# Patient Record
Sex: Female | Born: 1965
Health system: Southern US, Community
[De-identification: ages and names within clinical notes are randomized; demographics above are authoritative.]

## PROBLEM LIST (undated history)

## (undated) DIAGNOSIS — T8859XA Other complications of anesthesia, initial encounter: Secondary | ICD-10-CM

## (undated) DIAGNOSIS — K8020 Calculus of gallbladder without cholecystitis without obstruction: Secondary | ICD-10-CM

## (undated) DIAGNOSIS — R17 Unspecified jaundice: Secondary | ICD-10-CM

## (undated) DIAGNOSIS — D649 Anemia, unspecified: Secondary | ICD-10-CM

## (undated) DIAGNOSIS — T7840XA Allergy, unspecified, initial encounter: Secondary | ICD-10-CM

## (undated) DIAGNOSIS — N200 Calculus of kidney: Secondary | ICD-10-CM

## (undated) DIAGNOSIS — B001 Herpesviral vesicular dermatitis: Secondary | ICD-10-CM

## (undated) DIAGNOSIS — K648 Other hemorrhoids: Secondary | ICD-10-CM

## (undated) DIAGNOSIS — R87619 Unspecified abnormal cytological findings in specimens from cervix uteri: Secondary | ICD-10-CM

## (undated) DIAGNOSIS — R112 Nausea with vomiting, unspecified: Secondary | ICD-10-CM

## (undated) DIAGNOSIS — Z9889 Other specified postprocedural states: Secondary | ICD-10-CM

## (undated) DIAGNOSIS — C801 Malignant (primary) neoplasm, unspecified: Secondary | ICD-10-CM

## (undated) DIAGNOSIS — T4145XA Adverse effect of unspecified anesthetic, initial encounter: Secondary | ICD-10-CM

## (undated) HISTORY — PX: OTHER SURGICAL HISTORY: SHX169

## (undated) HISTORY — DX: Calculus of gallbladder without cholecystitis without obstruction: K80.20

## (undated) HISTORY — DX: Unspecified abnormal cytological findings in specimens from cervix uteri: R87.619

## (undated) HISTORY — DX: Herpesviral vesicular dermatitis: B00.1

## (undated) HISTORY — DX: Calculus of kidney: N20.0

## (undated) HISTORY — DX: Anemia, unspecified: D64.9

## (undated) HISTORY — DX: Allergy, unspecified, initial encounter: T78.40XA

## (undated) HISTORY — DX: Malignant (primary) neoplasm, unspecified: C80.1

## (undated) HISTORY — DX: Unspecified jaundice: R17

---
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
## 1997-11-14 ENCOUNTER — Ambulatory Visit (HOSPITAL_COMMUNITY): Admission: RE | Admit: 1997-11-14 | Discharge: 1997-11-14 | Payer: Self-pay | Admitting: Obstetrics and Gynecology

## 1998-06-06 HISTORY — PX: TUBAL LIGATION: SHX77

## 1998-10-13 ENCOUNTER — Ambulatory Visit (HOSPITAL_COMMUNITY): Admission: RE | Admit: 1998-10-13 | Discharge: 1998-10-13 | Payer: Self-pay | Admitting: Obstetrics and Gynecology

## 2000-07-28 ENCOUNTER — Other Ambulatory Visit: Admission: RE | Admit: 2000-07-28 | Discharge: 2000-07-28 | Payer: Self-pay | Admitting: Obstetrics and Gynecology

## 2001-10-25 ENCOUNTER — Other Ambulatory Visit: Admission: RE | Admit: 2001-10-25 | Discharge: 2001-10-25 | Payer: Self-pay | Admitting: Obstetrics and Gynecology

## 2003-04-30 ENCOUNTER — Other Ambulatory Visit: Admission: RE | Admit: 2003-04-30 | Discharge: 2003-04-30 | Payer: Self-pay | Admitting: Obstetrics and Gynecology

## 2003-06-17 ENCOUNTER — Ambulatory Visit (HOSPITAL_COMMUNITY): Admission: RE | Admit: 2003-06-17 | Discharge: 2003-06-17 | Payer: Self-pay | Admitting: Obstetrics and Gynecology

## 2004-02-24 ENCOUNTER — Other Ambulatory Visit: Admission: RE | Admit: 2004-02-24 | Discharge: 2004-02-24 | Payer: Self-pay | Admitting: Obstetrics and Gynecology

## 2004-07-23 ENCOUNTER — Other Ambulatory Visit: Admission: RE | Admit: 2004-07-23 | Discharge: 2004-07-23 | Payer: Self-pay | Admitting: Obstetrics and Gynecology

## 2004-07-30 ENCOUNTER — Encounter: Admission: RE | Admit: 2004-07-30 | Discharge: 2004-07-30 | Payer: Self-pay | Admitting: *Deleted

## 2005-03-22 ENCOUNTER — Encounter: Admission: RE | Admit: 2005-03-22 | Discharge: 2005-03-22 | Payer: Self-pay | Admitting: *Deleted

## 2005-06-06 HISTORY — PX: HEMORRHOID SURGERY: SHX153

## 2005-07-05 ENCOUNTER — Other Ambulatory Visit: Admission: RE | Admit: 2005-07-05 | Discharge: 2005-07-05 | Payer: Self-pay | Admitting: Obstetrics & Gynecology

## 2005-08-04 ENCOUNTER — Other Ambulatory Visit: Admission: RE | Admit: 2005-08-04 | Discharge: 2005-08-04 | Payer: Self-pay | Admitting: Obstetrics & Gynecology

## 2005-08-04 ENCOUNTER — Other Ambulatory Visit: Admission: RE | Admit: 2005-08-04 | Discharge: 2005-08-04 | Payer: Self-pay | Admitting: Obstetrics and Gynecology

## 2005-08-30 ENCOUNTER — Encounter (INDEPENDENT_AMBULATORY_CARE_PROVIDER_SITE_OTHER): Payer: Self-pay | Admitting: *Deleted

## 2005-08-30 ENCOUNTER — Ambulatory Visit (HOSPITAL_COMMUNITY): Admission: RE | Admit: 2005-08-30 | Discharge: 2005-08-30 | Payer: Self-pay | Admitting: General Surgery

## 2005-10-28 ENCOUNTER — Encounter: Admission: RE | Admit: 2005-10-28 | Discharge: 2005-10-28 | Payer: Self-pay | Admitting: Obstetrics and Gynecology

## 2005-11-04 ENCOUNTER — Other Ambulatory Visit: Admission: RE | Admit: 2005-11-04 | Discharge: 2005-11-04 | Payer: Self-pay | Admitting: Obstetrics and Gynecology

## 2005-12-28 ENCOUNTER — Other Ambulatory Visit: Admission: RE | Admit: 2005-12-28 | Discharge: 2005-12-28 | Payer: Self-pay | Admitting: Obstetrics and Gynecology

## 2006-05-09 ENCOUNTER — Other Ambulatory Visit: Admission: RE | Admit: 2006-05-09 | Discharge: 2006-05-09 | Payer: Self-pay | Admitting: Obstetrics and Gynecology

## 2006-06-06 HISTORY — PX: LITHOTRIPSY: SUR834

## 2006-06-06 HISTORY — PX: CHOLECYSTECTOMY: SHX55

## 2006-08-16 ENCOUNTER — Other Ambulatory Visit: Admission: RE | Admit: 2006-08-16 | Discharge: 2006-08-16 | Payer: Self-pay | Admitting: Obstetrics and Gynecology

## 2006-10-12 ENCOUNTER — Emergency Department (HOSPITAL_COMMUNITY): Admission: EM | Admit: 2006-10-12 | Discharge: 2006-10-12 | Payer: Self-pay | Admitting: Emergency Medicine

## 2006-10-17 ENCOUNTER — Ambulatory Visit: Payer: Self-pay | Admitting: Internal Medicine

## 2006-10-20 ENCOUNTER — Encounter: Admission: RE | Admit: 2006-10-20 | Discharge: 2006-10-20 | Payer: Self-pay | Admitting: Internal Medicine

## 2006-12-25 ENCOUNTER — Encounter (INDEPENDENT_AMBULATORY_CARE_PROVIDER_SITE_OTHER): Payer: Self-pay | Admitting: General Surgery

## 2006-12-25 ENCOUNTER — Ambulatory Visit (HOSPITAL_COMMUNITY): Admission: RE | Admit: 2006-12-25 | Discharge: 2006-12-25 | Payer: Self-pay | Admitting: General Surgery

## 2007-01-29 ENCOUNTER — Telehealth (INDEPENDENT_AMBULATORY_CARE_PROVIDER_SITE_OTHER): Payer: Self-pay | Admitting: *Deleted

## 2007-01-29 ENCOUNTER — Emergency Department (HOSPITAL_COMMUNITY): Admission: EM | Admit: 2007-01-29 | Discharge: 2007-01-29 | Payer: Self-pay | Admitting: Emergency Medicine

## 2007-01-29 ENCOUNTER — Telehealth: Payer: Self-pay | Admitting: Internal Medicine

## 2007-01-29 ENCOUNTER — Ambulatory Visit: Payer: Self-pay | Admitting: Internal Medicine

## 2007-01-29 ENCOUNTER — Ambulatory Visit: Payer: Self-pay | Admitting: Cardiology

## 2007-01-29 DIAGNOSIS — R319 Hematuria, unspecified: Secondary | ICD-10-CM | POA: Insufficient documentation

## 2007-01-29 DIAGNOSIS — B009 Herpesviral infection, unspecified: Secondary | ICD-10-CM | POA: Insufficient documentation

## 2007-01-29 DIAGNOSIS — R109 Unspecified abdominal pain: Secondary | ICD-10-CM | POA: Insufficient documentation

## 2007-01-29 LAB — CONVERTED CEMR LAB
Glucose, Urine, Semiquant: NEGATIVE
Nitrite: NEGATIVE
Specific Gravity, Urine: >=1.03
Urobilinogen, UA: 0.2
pH: 5.5

## 2007-02-01 ENCOUNTER — Encounter: Payer: Self-pay | Admitting: Internal Medicine

## 2007-02-07 ENCOUNTER — Other Ambulatory Visit: Admission: RE | Admit: 2007-02-07 | Discharge: 2007-02-07 | Payer: Self-pay | Admitting: Obstetrics and Gynecology

## 2007-02-12 ENCOUNTER — Ambulatory Visit (HOSPITAL_COMMUNITY): Admission: RE | Admit: 2007-02-12 | Discharge: 2007-02-12 | Payer: Self-pay | Admitting: Urology

## 2007-03-02 ENCOUNTER — Other Ambulatory Visit: Admission: RE | Admit: 2007-03-02 | Discharge: 2007-03-02 | Payer: Self-pay | Admitting: Obstetrics and Gynecology

## 2007-03-12 ENCOUNTER — Other Ambulatory Visit: Admission: RE | Admit: 2007-03-12 | Discharge: 2007-03-12 | Payer: Self-pay | Admitting: Obstetrics and Gynecology

## 2007-07-24 ENCOUNTER — Other Ambulatory Visit: Admission: RE | Admit: 2007-07-24 | Discharge: 2007-07-24 | Payer: Self-pay | Admitting: Obstetrics and Gynecology

## 2007-07-25 ENCOUNTER — Encounter: Payer: Self-pay | Admitting: Internal Medicine

## 2007-07-26 ENCOUNTER — Encounter: Payer: Self-pay | Admitting: Internal Medicine

## 2008-05-19 ENCOUNTER — Other Ambulatory Visit: Admission: RE | Admit: 2008-05-19 | Discharge: 2008-05-19 | Payer: Self-pay | Admitting: Obstetrics & Gynecology

## 2008-06-06 HISTORY — PX: LASER ABLATION: SHX1947

## 2008-12-24 ENCOUNTER — Ambulatory Visit: Payer: Self-pay | Admitting: Internal Medicine

## 2008-12-24 DIAGNOSIS — Z87442 Personal history of urinary calculi: Secondary | ICD-10-CM | POA: Insufficient documentation

## 2009-02-02 ENCOUNTER — Ambulatory Visit (HOSPITAL_BASED_OUTPATIENT_CLINIC_OR_DEPARTMENT_OTHER): Admission: RE | Admit: 2009-02-02 | Discharge: 2009-02-02 | Payer: Self-pay | Admitting: Obstetrics & Gynecology

## 2009-12-29 ENCOUNTER — Ambulatory Visit: Payer: Self-pay | Admitting: Internal Medicine

## 2010-03-10 ENCOUNTER — Encounter: Admission: RE | Admit: 2010-03-10 | Discharge: 2010-03-10 | Payer: Self-pay | Admitting: Obstetrics & Gynecology

## 2010-04-27 ENCOUNTER — Ambulatory Visit: Payer: Self-pay | Admitting: Internal Medicine

## 2010-04-27 DIAGNOSIS — L089 Local infection of the skin and subcutaneous tissue, unspecified: Secondary | ICD-10-CM | POA: Insufficient documentation

## 2010-05-27 ENCOUNTER — Ambulatory Visit: Payer: Self-pay | Admitting: Family Medicine

## 2010-06-27 ENCOUNTER — Encounter: Payer: Self-pay | Admitting: Obstetrics and Gynecology

## 2010-07-06 NOTE — Assessment & Plan Note (Signed)
Summary: bee sting/swelling/blisters/?inf/cjr   Vital Signs:  Patient profile:   45 year old female Menstrual status:  ablastion Height:      64 inches Weight:      159 pounds BMI:     27.39 Temp:     98.7 degrees F oral Pulse rate:   78 / minute BP sitting:   120 / 80  (left arm) Cuff size:   regular  Vitals Entered By: Romualdo Bolk, CMA (AAMA) (December 29, 2009 9:21 AM) CC: Bee sting on 7/24 on rt index finger. Swelling started on 7/25 and pt has taken benadryl and iced it. Pt is also got a some vaginal odor, slight vaginal discharge that is clear. No itching or burning upon urination. LMP - Character: heavy Menarche (age onset years): [age]   Menses interval (days): 28 Menstrual flow (days): 6 Menstrual Status ablastion   History of Present Illness: Meghan Frank comes in today  for acute visit for problem after an insect sting on right middle finger as  above. happened when riding bike and continued to go through the day.   sqeezed to check for stinger and saw none. had pain and itching and swelling. however today much more swollen and feels like finger going to burst  .  discomfort but not severe pain. some itch   .   Has hx of  large reactions local to stings but no analhylaxis or hives. Also has had some  vaginal odor  without sig  abnormal discharge . no lesons or ulcers . ? if bacterial infection . no itch or  antibioitic. use recently.  NO  uti signs   Preventive Screening-Counseling & Management  Alcohol-Tobacco     Alcohol drinks/day: 0     Smoking Status: quit     Packs/Day: 1/2     Year Quit: 2009  Caffeine-Diet-Exercise     Caffeine use/day: 1     Does Patient Exercise: no  Current Medications (verified): 1)  Valtrex 500 Mg  Tabs (Valacyclovir Hcl) .... Two Times A Day As Needed 2)  Cephalexin 500 Mg Caps (Cephalexin) .Marland Kitchen.. 1 By Mouth Three Times A Day For 7 Days  Allergies (verified): 1)  ! Sulfa  Past History:  Past medical, surgical, family and  social histories (including risk factors) reviewed, and no changes noted (except as noted below).  Past Medical History: Reviewed history from 12/24/2008 and no changes required. childbirth x2 Herpes labialis recurrent Nephrolithiasis, hx of   lithotrypsy.  Sep 2009     Past Surgical History: Reviewed history from 01/29/2007 and no changes required. Cholecystectomy (12/25/2006) btl Jaw surgery  Past History:  Care Management: Gynecology: Berneice Gandy, FNP Urology :   Family History: Reviewed history from 01/29/2007 and no changes required. Family History of CAD Female 1st degree relative <50 Family History Diabetes 1st degree relative Family History High cholesterol  Social History: Reviewed history from 12/24/2008 and no changes required. 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    Physical Exam  General:  alert and well-developed.   Head:  normocephalic, atraumatic, and no abnormalities observed.   Eyes:  vision grossly intact.   Neck:  No deformities, masses, or  tenderness noted. Lungs:  normal respiratory effort, no intercostal retractions, and no accessory muscle use.   Heart:  normal rate, regular rhythm, and no murmur.   Pulses:  pulses intact without delay   Extremities:  nl cap refill   riht middle finger with 2-3+ swelling pink only slightly warm and central sting area without F seen   dec rom from edema of whole finger to palm .   Neurologic:  alert & oriented X3, strength normal in all extremities, and gait normal.   Skin:  see finger exam  no other rashes  Psych:  Oriented X3, good eye contact, not anxious appearing, and not depressed appearing.     Impression &  Recommendations:  Problem # 1:  ? 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  can follow up with [facility] if persistent or  progressive   Her updated medication list for this problem includes:    Cephalexin 500 Mg Caps (Cephalexin) .Marland Kitchen... 1 by mouth three times a day for 7 days    Flagyl 500 Mg Tabs (Metronidazole) .Marland Kitchen... 1 by mouth two times a day  Orders: Prescription Created Electronically [id number])  Complete Medication List: 1)  Valtrex 500 Mg Tabs (Valacyclovir hcl) .... Two times a day as needed 2)  Cephalexin 500 Mg Caps (Cephalexin) .Marland Kitchen.. 1 by mouth three times a day for 7 days 3)  Flagyl 500 Mg Tabs (Metronidazole) .Marland Kitchen.. 1 by mouth two times a day  Patient Instructions: 1)  elevate finger    2)  begin antibiotic    elevation  and benadryl  also . 3)  expect improvement    in next 48 hours .  4)  take flagyl  after finger getting better.  for vaginal infection Prescriptions: FLAGYL 500 MG TABS (METRONIDAZOLE) 1 by mouth two times a day  #14 x 0   Entered and Authorized by:   Madelin Headings MD   Signed by:   Madelin Headings MD on 12/29/2009   Method used:   [facility]        [facility] [address] [phone]* (retail)       [address]       [place], [place]  [id number]       Ph: [phone] or [phone]       Fax: [phone]   RxID:   [id number] CEPHALEXIN 500 MG CAPS (CEPHALEXIN) 1 by mouth three times a day for 7 days  #21 x 0   Entered and Authorized by:    Madelin Headings MD   Signed by:   Madelin Headings MD on 12/29/2009   Method used:   [facility]        [facility]  [address] [phone]* (retail)       [address]       [place], [place]  [id number]  Ph: [phone] or [phone]       Fax: [phone]   RxID:   [id number]

## 2010-07-06 NOTE — Assessment & Plan Note (Signed)
Summary: EVAL OF CYST BEHIND EAR // RS   Vital Signs:  Patient profile:   45 year old female Menstrual status:  ablastion Weight:      157 pounds Temp:     99.0 degrees F oral Pulse rate:   78 / minute BP sitting:   110 / 60  (left arm) Cuff size:   regular  Vitals Entered By: Romualdo Bolk, CMA (AAMA) (April 27, 2010 2:52 PM) CC: Knot behind rt ear that is red and sore. Pt states it started on 11/19. It was more swollen a couple of days ago but the knot has gone down. It is still painful.   History of Present Illness: Meghan Frank  comes in today  for above  problem with 1 weeks of  of above   No trauma.  some better still tendern.   had ? hearing change nad tenderness of ear  outer also now better .Marland KitchenNO rx . No uri .    Preventive Screening-Counseling & Management  Alcohol-Tobacco     Alcohol drinks/day: 0     Smoking Status: quit     Packs/Day: 1/2     Year Quit: 2009  Caffeine-Diet-Exercise     Caffeine use/day: 1     Does Patient Exercise: no  Current Medications (verified): 1)  Valtrex 500 Mg  Tabs (Valacyclovir Hcl) .... Two Times A Day As Needed  Allergies (verified): 1)  ! Sulfa  Past History:  Past medical, surgical, family and social histories (including risk factors) reviewed, and no changes noted (except as noted below).  Past Medical History: Reviewed history from 12/24/2008 and no changes required. childbirth x2 Herpes labialis recurrent Nephrolithiasis, hx of   lithotrypsy.  Sep 2009     Past Surgical History: Reviewed history from 01/29/2007 and no changes required. Cholecystectomy (12/25/2006) btl Jaw surgery  Past History:  Care Management: Gynecology: Berneice Gandy, FNP Urology :   Family History: Reviewed history from 01/29/2007 and no changes required. Family History of CAD Female 1st degree relative <50 Family History Diabetes 1st degree relative Family History High cholesterol  Social History: Reviewed history from 12/24/2008  and no changes required. 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    Physical Exam  General:  Well-developed,well-nourished,in no acute distress; alert,appropriate and cooperative throughout examination Eyes:  clear  Ears:  L ear normal, no external deformities, and ear piercing(s) noted.    one whole not being used.  right tm nl  ovoid 1cm soft swelling mildly pink at crease between pinna and scalp no lesions  small infra autricular  node mobile and mildly tender  Nose:  no external deformity and no nasal discharge.   Mouth:  pharynx pink and moist.   Neck:  No deformities, masses, or tenderness noted. Lungs:  normal respiratory effort and no intercostal retractions.   Skin:  see ear exam  no vesicle or blisters Cervical Nodes:  no anterior cervical adenopathy and no posterior cervical adenopathy.   except small reactive infraauricular    Impression & Recommendations:  Problem # 1:  UNSPEC LOCAL INFECTION SKIN&SUBCUTANEOUS TISSUE (ICD-686.9) ? cystic but no  nodular    seems like skin infection in intertrigenous area of ear .  no skin weeping  and no fluctuance   although soft swelling  linear noted and poss secondary LN reactive .  disc  issues and will add antibiotic.     Complete Medication List: 1)  Valtrex 500 Mg Tabs (Valacyclovir hcl) .... Two times a day as needed 2)  Cephalexin 500 Mg Caps (Cephalexin) .Marland Kitchen.. 1 by mouth two times a day for 5-7 days  Other Orders: Admin 1st Vaccine (95621) Flu Vaccine 60yrs + (30865)  Patient Instructions: 1)  Warm compresses to local area . and antibioitc  2)  I think   is an infected cyst getting better . 3)  Expect resolution  or signiificant  improvement  in then    next  5 days.  Prescriptions: CEPHALEXIN 500 MG CAPS  (CEPHALEXIN) 1 by mouth two times a day for 5-7 days  #14 x 0   Entered and Authorized by:   Madelin Headings MD   Signed by:   Madelin Headings MD on 04/27/2010   Method used:   [facility]        [facility] [address] [phone]* (retail)       [address]       [place], [place]  [id number]       Ph: [phone] or [phone]       Fax: [phone]   RxID:   [id number]    Orders Added: 1)  Admin 1st Vaccine [90471] 2)  Flu Vaccine 73yrs + [30160] 3)  Est. Patient Level III [10932]     Flu Vaccine Consent Questions     Do you have a history of severe allergic reactions to this vaccine? no    Any prior history of allergic reactions to egg and/or gelatin? no    Do you have a sensitivity to the preservative Thimersol? no    Do you have a past history of Guillan-Barre Syndrome? no    Do you currently have an acute febrile illness? no    Have you ever had a severe reaction to latex? no    Vaccine information given and explained to patient? yes    Are you currently pregnant? no    Lot Number:[id number]   Exp Date:12/04/2010   Site Given  Left Deltoid IM .lbflu Romualdo Bolk, CMA Duncan Dull)  April 27, 2010 2:56 PM

## 2010-07-08 NOTE — Assessment & Plan Note (Signed)
Summary: st/cough/njr   Vital Signs:  Patient profile:   45 year old female Menstrual status:  ablastion Temp:     98.2 degrees F oral BP sitting:   120 / 80  (left arm) Cuff size:   regular  Vitals Entered By: Sid Falcon LPN (May 27, 2010 4:09 PM)  History of Present Illness: One week hx of URI symptoms of sore throat, postnasal drip, cough, fatigue, and bil ear pressure.  No signif headache. Sore throat is some better now. Concerned that things tend to settle in her chest and difficult to clear. Ex-smoker.  No ill contacts.  Allergies: 1)  ! Sulfa  Past History:  Past Medical History: Reviewed history from 12/24/2008 and no changes required. childbirth x2 Herpes labialis recurrent Nephrolithiasis, hx of   lithotrypsy.  Sep 2009     Social History: Reviewed history from 12/24/2008 and no changes required. 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  Otoscopic examination reveals clear canals, tympanic membranes are intact bilaterally without bulging, retraction, inflammation or discharge. Hearing is grossly normal bilaterally. Mouth:  Oral mucosa and oropharynx without lesions or exudates.  Teeth in good repair. Neck:  No deformities, masses, or tenderness noted. Lungs:  Normal respiratory effort, chest expands symmetrically. Lungs are clear to auscultation, no crackles or wheezes. Heart:  normal rate, regular rhythm, and no murmur.     Impression & Recommendations:  Problem # 1:  ACUTE BRONCHITIS (ICD-466.0) suspect viral  We have not rec any antibiotics at this time but printed rx to fill only if symptoms worsen. Her updated medication list for this problem includes:    Azithromycin 250 Mg  Tabs (Azithromycin) .Marland Kitchen... 2 by mouth today then one by mouth once daily for 4 days  Complete Medication List: 1)  Valtrex 500 Mg Tabs (Valacyclovir hcl) .... Two times a day as needed 2)  Azithromycin 250 Mg Tabs (Azithromycin) .... 2 by mouth today then one by mouth once daily for 4 days  Patient Instructions: 1)  Acute Bronchitis symptoms for less then 10 days are not  helped by antibiotics. Take over the counter cough medications. Call if no improvement in 5-7 days, sooner if increasing cough, fever, or new symptoms ( shortness of breath, chest pain) .  Prescriptions: AZITHROMYCIN 250 MG TABS (AZITHROMYCIN) 2 by mouth today then one by mouth once daily for 4 days  #6 x 0   Entered and Authorized by:   Evelena Peat MD   Signed by:   Evelena Peat MD on 05/27/2010   Method used:   Print then Give to Patient   RxID:   [id number]    Orders Added: 1)  Est. Patient Level III [56213]

## 2010-07-12 ENCOUNTER — Other Ambulatory Visit: Payer: Self-pay | Admitting: Dermatology

## 2010-09-11 LAB — CBC
HCT: 36.7 % (ref 36.0–46.0)
Hemoglobin: 12.5 g/dL (ref 12.0–15.0)
MCHC: 34.2 g/dL (ref 30.0–36.0)
MCV: 95.4 fL (ref 78.0–100.0)
Platelets: 211 10*3/uL (ref 150–400)
RBC: 3.85 MIL/uL — ABNORMAL LOW (ref 3.87–5.11)
RDW: 12.9 % (ref 11.5–15.5)
WBC: 4.8 10*3/uL (ref 4.0–10.5)

## 2010-09-11 LAB — URINALYSIS, ROUTINE W REFLEX MICROSCOPIC
Bilirubin Urine: NEGATIVE
Glucose, UA: NEGATIVE mg/dL
Ketones, ur: NEGATIVE mg/dL
Nitrite: NEGATIVE
Protein, ur: NEGATIVE mg/dL
Specific Gravity, Urine: 1.023 (ref 1.005–1.030)
Urobilinogen, UA: 0.2 mg/dL (ref 0.0–1.0)
pH: 5.5 (ref 5.0–8.0)

## 2010-09-11 LAB — POCT PREGNANCY, URINE: Preg Test, Ur: NEGATIVE

## 2010-09-11 LAB — URINE MICROSCOPIC-ADD ON

## 2010-10-19 NOTE — Op Note (Signed)
NAMESIBONEY, REQUEJO               ACCOUNT NO.:  [id number]   MEDICAL RECORD NO.:  [id number]          PATIENT TYPE:  AMB   LOCATION:  SDS                          FACILITY:  [facility]   PHYSICIAN:  Ollen Gross. Vernell Morgans, M.D. DATE OF BIRTH:  04/25/1966   DATE OF PROCEDURE:  12/25/2006  DATE OF DISCHARGE:                               OPERATIVE REPORT   PREOPERATIVE DIAGNOSIS:  Gallstones.   POSTOPERATIVE DIAGNOSIS:  Gallstones.   PROCEDURES:  Laparoscopic cholecystectomy with intraoperative  cholangiogram.   SURGEON:  Ollen Gross. Vernell Morgans, M.D.   ASSISTANT:  Angelia Mould. Derrell Lolling, M.D.   ANESTHESIA:  General endotracheal.   PROCEDURE:  After informed consent was obtained, the patient was brought  to the operating room, placed in supine position on the table.  After  induction of general anesthesia, the patient's abdomen was prepped with  Betadine and draped in usual sterile manner.  The area below the  umbilicus was infiltrated with 4% Marcaine.  A small incision was made  with 15 blade knife.  This incision was carried down through the  subcutaneous tissue bluntly with hemostat and [employer] retractors until  the linea alba was identified.  The linea alba was incised with a 15  blade knife and each side was grasped with Kocher clamps and elevated  anteriorly.  The preperitoneal space was then probed bluntly with a  hemostat until the peritoneum was opened and access was gained to the  abdominal cavity.  A 0-0 Vicryl pursestring stitch was placed in the  fascia around the opening Hasson cannula was placed through the opening  and anchored in place with the previously placed Vicryl pursestring  stitch.  The abdomen was then insufflated with carbon dioxide without  difficulty.  The patient was placed in reverse Trendelenburg position  and rotated with the right side up.  The laparoscope was inserted  through the Hasson cannula and the right upper quadrant was inspected.  The dome of  gallbladder and liver readily identified.  The rest see  abdomen was inspected.  No other abnormalities were noted.  The patient  had a small adhesive band on the top of the liver.  The epigastric  region was then infiltrated 0.25% Marcaine.  A small incision was made  with a 15 blade knife and a 10 mm port was placed bluntly through this  incision into the abdominal cavity under direct vision.  Sites then  chosen laterally on the right side of the abdomen for placement of 5-mm  ports.  Each of these areas was infiltrated with 0.25% Marcaine.  Small  stab incisions were made with a 15 blade knife.  5 mm ports were placed  bluntly through these incisions into the abdominal cavity under direct  vision.  A blunt grasper was placed through the lateral-most 5-mm port  and used to grasp the dome of gallbladder and elevate it anteriorly and  superiorly.  The small band on top of the liver was divided sharply with  a laparoscopic scissors.  Another blunt grasper was placed through the  other 5 mm port and used  to retract on the body and neck of the  gallbladder.  A dissector was placed through the epigastric port and  using electrocautery, the peritoneal reflection was opened at the  gallbladder neck.  Blunt dissection was then carried out in this area  until the gallbladder neck, cystic duct junction was readily identified.  A good window was created.  A single clip was placed on the gallbladder  neck.  A small ductotomy was made just below the clip.  A 14 gauge  Angiocath was then placed percutaneously through the anterior abdominal  wall under direct vision.  A Reddick cholangiogram catheter placed  through the angiocath and flushed.  The Reddick catheter was then placed  within the cystic duct and anchored in place with a clip.  Cholangiogram  was obtained that showed no filling defects, good emptying into the  duodenum and adequate length on the cystic duct.  The anchoring clip and  catheters  were removed from the patient.  Three clips were placed  proximally on the cystic duct and the duct was divided between the two  sets of clips.  Posterior to this the cystic artery was identified and  again dissected bluntly in a circumferential manner until a good window  was created.  Two clips placed proximally and one distally on the artery  and the artery was divided between the two.  Next a laparoscopic hook  cautery device was used to separate the gallbladder from the liver bed.  Prior to completely detaching gallbladder from the liver bed, the liver  bed was inspected and several small bleeding points were coagulated with  electrocautery until the area was completely hemostatic.  Gallbladder  was then detached the rest of the way from liver bed without difficulty  with a hook cautery.  The laparoscope was then moved to the epigastric  port.  The gallbladder grasper was placed through the Hasson cannula and  used to grasp the neck of the gallbladder.  The gallbladder was removed  through the infraumbilical port with the Hasson cannula without  difficulty.  The abdomen was then irrigated copious amounts of saline  until the effluent was clear.  The liver bed was inspected again and  found to be hemostatic.  The fascial defect was closed with the  previously placed Vicryl pursestring stitch as well as with another  figure-of-eight zero Vicryl stitch.  The rest of the ports were removed  under direct vision and were all found to be hemostatic.  Gas was  allowed to escape.  The skin incisions were all closed with interrupted  4-0 Monocryl subcuticular stitches.  Benzoin and Steri-Strips were  applied.  The patient tolerated the procedure well.  At the end of the  case all needle, sponge and instrument counts correct.  The patient was  awakened, taken recovery room in stable condition.      Ollen Gross. Vernell Morgans, M.D.  Electronically Signed     PST/MEDQ  D:  12/25/2006  T:  12/25/2006   Job:  [id number]

## 2010-10-19 NOTE — Op Note (Signed)
Meghan Frank, Meghan Frank               ACCOUNT NO.:  [id number]   MEDICAL RECORD NO.:  [id number]          PATIENT TYPE:  AMB   LOCATION:  [facility]                         FACILITY:  [facility]   PHYSICIAN:  M. Leda Quail, MD  DATE OF BIRTH:  November 12, 1965   DATE OF PROCEDURE:  02/02/2009  DATE OF DISCHARGE:                               OPERATIVE REPORT   PREOPERATIVE DIAGNOSES:  64. A 45 year old G2 P2 divorced white female with history of      menorrhagia.  2. Dysfunctional uterine bleeding.  3. History of postpartum bilateral tubal ligation in 2000.  4. Endometrial biopsy obtained January 14, 2009 which was negative.   POSTOPERATIVE DIAGNOSES:  18. A 45 year old G2 P2 divorced white female with history of      menorrhagia.  2. Dysfunctional uterine bleeding.  3. History of postpartum bilateral tubal ligation in 2000.  4. Endometrial biopsy obtained January 14, 2009 which was negative.   PROCEDURE:  Hysteroscopy and NovaSure ablation.   SURGEON:  M. Leda Quail, MD   ASSISTANT:  OR staff.   ANESTHESIA:  LMA, Dr. Rica Mast oversaw the case.   FINDINGS:  Normal-appearing endometrial cavity.  Endometrium is thin.  The patient just recently had her menstrual cycle.  The tubal ostia  noted bilaterally.   SPECIMENS:  None.   ESTIMATED BLOOD LOSS:  Minimal.   FLUIDS:  800 mL of LR.   URINE OUTPUT:  200 mL, drained with I and O catheterization at the  beginning of the procedure.   FLUID DEFICIT:  40 mL of LR.   COMPLICATIONS:  None.   INDICATIONS:  Ms. Andrey Campanile is very nice 45 year old G2 P2 divorced white  female who has a history of menorrhagia, irregular bleeding.  She has  tried birth control pills in the past and has issues with a weight gain  on them.  She was initially evaluated with an ultrasound on January 14, 2009.  Uterus measured 10 x 4.5 x 6 cm.  Her endometrium was quite  thick.  She did have one small involuting cyst on the right ovary and  her sonohysterogram was  negative.  A biopsy was obtained which was also  negative.  We discussed options and as she has failed conservative  therapy due to side effects in the past, she was ready to proceed with  the other options and we discussed hormonal IUD use, endometrial  ablation, progestin therapy and she after discussing risks and benefits  of each of these decided to proceed with endometrial dilation.  The  procedure was described.  The risks and benefits were described.  This  is all documented in her chart she went home and discussed this with her  significant other and decided to proceed.   HOSPITAL COURSE:  The patient was admitted through same-day surgery.  She is taken to the operating room.  She is placed in supine position.  Anesthesia was administered by anesthesia staff without difficulty.  She  had a running IV in place in her left arm.  Legs were positioned in the  low lithotomy position in [place] stirrups.  SCDs were present on lower  extremities bilaterally.  Legs were lifted to the high lithotomy  position.  Perineum, inner thighs and vagina were prepped in normal  sterile fashion.  A red rubber Foley catheter was used to drain the  bladder of all urine.  The patient was then draped in normal sterile  fashion.   Attention is turned toward the vagina.  A bivalve speculum placed in the  vagina.  The cervix is well visualized.  The anterior lip of the cervix  was grasped with a single-tooth tenaculum.  A paracervical block with 1%  lidocaine mixed 1:1 with epinephrine (1:200,000 units) was instilled.  10 mL total was used.  The uterus was then sounded to 9 cm.  Using Hegar  dilators, the cervix was dilated up to #7.  A 5-mm diagnostic  hysteroscope was obtained.  It was passed through the endocervical canal  and the endometrial cavity was visualized.  The tissue was thin and the  tubal ostia were noted bilaterally.  There were no abnormal shapes in  the cavity.  Photo documentation was  made.  The hysteroscope was  removed.  The cervix dilated up to a #8.  Then the NovaSure ablation  device was obtained.  The array was opened outside the patient.  The  vacuum valve was tapped.  It was working properly.  The device was then  closed.  It was passed through the endocervical canal to the fundus of  the uterus.  The array was opened inside the patient.  Then the device  was seated by moving the device anterior, posterior, to the right and  left and twisting 90 degrees in each direction.  This was performed  again.  The cavity length was 4.5 cm.  The array opened to a 4.4 for  cavity width and power at this point was 109.  At this point cavity  assessment test was performed and passed without difficulty.  The  ablation cycle was started.  It ended after approximately 120 seconds.  The array was then closed.  The device was brought out through the  cervix.  The hysteroscope was used to visualize the cavity again and  excellent ablation was present with good cauterization up to the cornu  of the uterus bilaterally.  The hysteroscope was removed.  There was 40  mL deficit of LR hysteroscopic fluid for the case.  There was similar  amount leaking on the floor.  The tenaculum was removed from the cervix.  Two silver nitrate were used to obtain hemostasis at tenaculum sites.  The speculum was removed from the vagina.   Betadine prep was cleansed off the skin.  Legs were positioned back in  the supine position.  The patient was awake from anesthesia.  Sponge,  lap, needle and instrument counts were correct x2.  The patient was  taken to the recovery room stable condition.      Lum Keas, MD  Electronically Signed     MSM/MEDQ  D:  02/02/2009  T:  02/02/2009  Job:  [id number]

## 2010-10-22 NOTE — Op Note (Signed)
NAMESHYLIN, KEIZER               ACCOUNT NO.:  [id number]   MEDICAL RECORD NO.:  [id number]          PATIENT TYPE:  AMB   LOCATION:  DAY                          FACILITY:  [facility]   PHYSICIAN:  Timothy E. Earlene Plater, M.D. DATE OF BIRTH:  1965-10-20   DATE OF PROCEDURE:  08/30/2005  DATE OF DISCHARGE:  08/30/2005                                 OPERATIVE REPORT   PREOPERATIVE DIAGNOSIS:  External hemorrhoids.   POSTOPERATIVE DIAGNOSIS:  External hemorrhoids.   PROCEDURE:  External hemorrhoidectomy.   SURGEON:  Timothy E. Earlene Plater, M.D.   ANESTHESIA:  General.   Ms. Meghan Frank is a healthy 45 year old female who has developed and maintained  external hemorrhoids since her pregnancies beginning 18 years ago.  She  reports that her bowel action is normal at this time, and she would like to  have these external hemorrhoids removed due to irritation, pain, discomfort,  drainage and difficulty cleansing.  She is prepared and ready.  She is  identified and the permit signed.   The patient is taken to the operating room, placed supine, LMA anesthesia  provided.  She was placed in lithotomy, the perianal area inspected, prepped  and draped.  Digital anoscopic exam revealed no evidence of internal  disease.  Specifically, at the right posterior, left posterior and left  anterior aspect of the anal orifice were redundant external skin and tags  with moderate anodermal components.  The area was injected around and about  with Marcaine, epinephrine and Wydase, massaged in well.  Very carefully  without the scope in place, the external tags were simply excised.  Each  area was then undermined and the superficial varicosities removed and then  each wound carefully closed with running 3-0 chromic suture.  The incisions  were at the right posterior, left posterior, left anterior and then a small  tag at the direct posterior area were all removed.  The result was  satisfactory.  The perianal skin was smooth  and flat.  There were no  excesses.  No complications.  Sphincter was intact.  Gelfoam gauze and dry  sterile dressing applied.  She had tolerated it well, was removed to the  recovery room in good condition.   Careful written and verbal instructions were given her and her father along  with Percocet for pain, and she will be seen and followed in the office.      Timothy E. Earlene Plater, M.D.  Electronically Signed     TED/MEDQ  D:  08/30/2005  T:  09/01/2005  Job:  [id number]

## 2011-03-21 LAB — DIFFERENTIAL
Basophils Absolute: 0
Basophils Relative: 0
Eosinophils Absolute: 0.1
Eosinophils Relative: 1
Lymphocytes Relative: 26
Lymphs Abs: 1.7
Monocytes Absolute: 0.5
Monocytes Relative: 8
Neutro Abs: 4.3
Neutrophils Relative %: 65

## 2011-03-21 LAB — COMPREHENSIVE METABOLIC PANEL
ALT: 22
AST: 17
Albumin: 4.1
Alkaline Phosphatase: 42
BUN: 14
CO2: 25
Calcium: 9.6
Chloride: 109
Creatinine, Ser: 0.72
GFR calc Af Amer: >60
GFR calc non Af Amer: >60
Glucose, Bld: 84
Potassium: 4.2
Sodium: 139
Total Bilirubin: 1.9 — ABNORMAL HIGH
Total Protein: 7

## 2011-03-21 LAB — CBC
HCT: 36.9
Hemoglobin: 12.6
MCHC: 34
MCV: 93.8
Platelets: 259
RBC: 3.94
RDW: 13.3
WBC: 6.6

## 2011-04-28 ENCOUNTER — Encounter (HOSPITAL_COMMUNITY): Payer: Self-pay | Admitting: *Deleted

## 2011-04-28 ENCOUNTER — Observation Stay (HOSPITAL_COMMUNITY)
Admission: AD | Admit: 2011-04-28 | Discharge: 2011-04-29 | Disposition: A | Discharge status: Home / Self Care | Payer: BC Managed Care – PPO | Source: Ambulatory Visit | Attending: Urology | Admitting: Urology

## 2011-04-28 DIAGNOSIS — N201 Calculus of ureter: Principal | ICD-10-CM | POA: Insufficient documentation

## 2011-04-28 DIAGNOSIS — Z87442 Personal history of urinary calculi: Secondary | ICD-10-CM

## 2011-04-28 DIAGNOSIS — R109 Unspecified abdominal pain: Secondary | ICD-10-CM | POA: Insufficient documentation

## 2011-04-28 DIAGNOSIS — R112 Nausea with vomiting, unspecified: Secondary | ICD-10-CM | POA: Insufficient documentation

## 2011-04-28 DIAGNOSIS — N200 Calculus of kidney: Secondary | ICD-10-CM | POA: Insufficient documentation

## 2011-04-28 HISTORY — DX: Nausea with vomiting, unspecified: R11.2

## 2011-04-28 HISTORY — DX: Adverse effect of unspecified anesthetic, initial encounter: T41.45XA

## 2011-04-28 HISTORY — DX: Other complications of anesthesia, initial encounter: T88.59XA

## 2011-04-28 HISTORY — DX: Other specified postprocedural states: Z98.890

## 2011-04-28 LAB — BASIC METABOLIC PANEL
BUN: 19 mg/dL (ref 6–23)
CO2: 24 mEq/L (ref 19–32)
Chloride: 104 mEq/L (ref 96–112)
Creatinine, Ser: 0.84 mg/dL (ref 0.50–1.10)

## 2011-04-28 LAB — CBC
HCT: 31 % — ABNORMAL LOW (ref 36.0–46.0)
Hemoglobin: 11 g/dL — ABNORMAL LOW (ref 12.0–15.0)
MCV: 91.2 fL (ref 78.0–100.0)
RBC: 3.4 MIL/uL — ABNORMAL LOW (ref 3.87–5.11)
WBC: 7.5 10*3/uL (ref 4.0–10.5)

## 2011-04-28 MED ORDER — CEFAZOLIN SODIUM-DEXTROSE 2-3 GM-% IV SOLR
2.0000 g | INTRAVENOUS | Status: AC
  Administered 2011-04-29: 2 g via INTRAVENOUS
  Filled 2011-04-28: qty 50

## 2011-04-28 MED ORDER — KETOROLAC TROMETHAMINE 30 MG/ML IJ SOLN
30.0000 mg | Freq: Four times a day (QID) | INTRAMUSCULAR | Status: DC
  Administered 2011-04-28 – 2011-04-29 (×3): 30 mg via INTRAVENOUS
  Filled 2011-04-28 (×4): qty 1
  Filled 2011-04-28: qty 2

## 2011-04-28 MED ORDER — ONDANSETRON HCL 4 MG/2ML IJ SOLN
4.0000 mg | Freq: Four times a day (QID) | INTRAMUSCULAR | Status: DC
  Administered 2011-04-28 – 2011-04-29 (×2): 4 mg via INTRAVENOUS
  Filled 2011-04-28 (×2): qty 2

## 2011-04-28 MED ORDER — KCL IN DEXTROSE-NACL 10-5-0.45 MEQ/L-%-% IV SOLN
INTRAVENOUS | Status: DC
  Administered 2011-04-28: 23:00:00 via INTRAVENOUS
  Filled 2011-04-28 (×3): qty 1000

## 2011-04-28 MED ORDER — HYDROMORPHONE HCL PF 1 MG/ML IJ SOLN
0.5000 mg | INTRAMUSCULAR | Status: DC | PRN
  Administered 2011-04-28 – 2011-04-29 (×3): 0.5 mg via INTRAVENOUS
  Filled 2011-04-28 (×3): qty 1

## 2011-04-28 MED ORDER — TAMSULOSIN HCL 0.4 MG PO CAPS: 0.4000 mg | ORAL_CAPSULE | ORAL | Status: DC

## 2011-04-28 MED ORDER — ACETAMINOPHEN 10 MG/ML IV SOLN
1000.0000 mg | Freq: Four times a day (QID) | INTRAVENOUS | Status: DC
  Administered 2011-04-28 – 2011-04-29 (×3): 1000 mg via INTRAVENOUS
  Filled 2011-04-28 (×4): qty 100

## 2011-04-28 NOTE — H&P (Signed)
Meghan Frank is an 45 y.o. female.    HPI: The patient is a 45 year old female who was seen yesterday as an outpatient, complaining of right flank pain, now with right and left flank pain for greater than 24 hours. She also has lower left abdominal pain, and urethral pain. She has had nausea and vomiting. The patient has a known 4 mm left proximal ureteral calculus, and bilateral renal stones, measuring 6 mm x 4 mm. CT scan yesterday shows 2 mm proximal right ureteral stone, with migration of the left ureteral stone into the distal left ureter. The patient has 2 remaining calculi within the right kidney. She is admitted for nausea and vomiting control, pain control, and will undergo cystoscopy, bilateral retrograde Polygram, and basket extraction tomorrow morning.  Past Medical History  Diagnosis Date  . Complication of anesthesia     "doesn't wake up well"  . PONV (postoperative nausea and vomiting)     Past Surgical History  Procedure Date  . Cholecystectomy 2008  . Tubal ligation 2000  . Laser ablation 2010    uterine ablation  . Lithotripsy 2008  . Mandible surgery 1985    No current facility-administered medications on file as of 04/28/2011.   No current outpatient prescriptions on file as of 04/28/2011.    Allergies:  Allergies  Allergen Reactions  . Sulfonamide Derivatives     History reviewed. No pertinent family history.  Social History:  reports that she quit smoking 2 days ago. Her smoking use included Cigarettes. She has never used smokeless tobacco. She reports that she does not drink alcohol or use illicit drugs.  Review of Systems: Pertinent items are noted in HPI. A comprehensive review of systems was negative except for: Constitutional: positive for fatigue Gastrointestinal: positive for abdominal pain, constipation, nausea and vomiting  No results found for this or any previous visit (from the past 48 hour(s)).  No results found.     Physical  Exam: General appearance: alert and appears stated age Head: Normocephalic, without obvious abnormality, atraumatic Eyes: conjunctivae/corneas clear. EOM's intact.  Oropharynx: moist mucous membranes Neck: supple, symmetrical, trachea midline Resp: normal respiratory effort Cardio: regular rate and rhythm Back: symmetric, no curvature. ROM normal. No CVA tenderness. GI: soft, non-tender; bowel sounds distant; no masses,  no organomegaly. Bilateral flank pain, LLQ pain, and L inguinal pain. no hernias Pelvic: deferred Extremities: extremities normal, atraumatic, no cyanosis or edema Skin: Skin color normal. No visible rashes or lesions Neurologic: Grossly normal  Assessment/Plan Bilateral ureteral totnes, 4mm L distal stone with hydronephrosis, and 2mm R upper ureteral stone, failing outpatient Rx of Flomax, and pain med. Will plan to admit for nausea/vomiting/pain control, and will remove stones in AM.   Jonathyn Carothers I 04/28/2011, 8:46 PM

## 2011-04-29 ENCOUNTER — Observation Stay (HOSPITAL_COMMUNITY): Payer: BC Managed Care – PPO | Admitting: Anesthesiology

## 2011-04-29 ENCOUNTER — Encounter (HOSPITAL_COMMUNITY)
Admission: AD | Disposition: A | Discharge status: Home / Self Care | Payer: Self-pay | Source: Ambulatory Visit | Attending: Urology

## 2011-04-29 ENCOUNTER — Encounter (HOSPITAL_COMMUNITY): Payer: Self-pay | Admitting: Anesthesiology

## 2011-04-29 HISTORY — PX: CYSTOSCOPY/RETROGRADE/URETEROSCOPY/STONE EXTRACTION WITH BASKET: SHX5317

## 2011-04-29 LAB — SURGICAL PCR SCREEN
MRSA, PCR: NEGATIVE
Staphylococcus aureus: NEGATIVE

## 2011-04-29 LAB — URINE MICROSCOPIC-ADD ON

## 2011-04-29 LAB — URINALYSIS, ROUTINE W REFLEX MICROSCOPIC
Bilirubin Urine: NEGATIVE
Glucose, UA: NEGATIVE mg/dL
Ketones, ur: NEGATIVE mg/dL
Leukocytes, UA: NEGATIVE
pH: 6.5 (ref 5.0–8.0)

## 2011-04-29 SURGERY — CYSTOSCOPY, WITH CALCULUS REMOVAL USING BASKET
Anesthesia: General | Site: Ureter | Laterality: Bilateral | Wound class: Clean Contaminated

## 2011-04-29 MED ORDER — OXYCODONE-ACETAMINOPHEN 5-325 MG PO TABS: 1.0000 | ORAL_TABLET | Freq: Four times a day (QID) | ORAL | Status: DC | PRN

## 2011-04-29 MED ORDER — MIDAZOLAM HCL 5 MG/5ML IJ SOLN
INTRAMUSCULAR | Status: DC | PRN
  Administered 2011-04-29 (×2): 1 mg via INTRAVENOUS

## 2011-04-29 MED ORDER — PROPOFOL 10 MG/ML IV EMUL
INTRAVENOUS | Status: DC | PRN
  Administered 2011-04-29: 120 mg via INTRAVENOUS

## 2011-04-29 MED ORDER — POTASSIUM CITRATE ER 15 MEQ (1620 MG) PO TBCR
2.0000 | EXTENDED_RELEASE_TABLET | Freq: Two times a day (BID) | ORAL | Status: DC
Start: 2011-04-29 — End: 2011-04-29

## 2011-04-29 MED ORDER — TAMSULOSIN HCL 0.4 MG PO CAPS
0.4000 mg | ORAL_CAPSULE | Freq: Every day | ORAL | Status: DC
  Administered 2011-04-29: 0.4 mg via ORAL
  Filled 2011-04-29 (×2): qty 1

## 2011-04-29 MED ORDER — BELLADONNA ALKALOIDS-OPIUM 16.2-60 MG RE SUPP
RECTAL | Status: DC | PRN
  Administered 2011-04-29: 1 via RECTAL

## 2011-04-29 MED ORDER — ACETAMINOPHEN 325 MG PO TABS: 650.0000 mg | ORAL_TABLET | Freq: Four times a day (QID) | ORAL | Status: DC | PRN

## 2011-04-29 MED ORDER — HYDROCODONE-ACETAMINOPHEN 7.5-650 MG PO TABS: 1.0000 | ORAL_TABLET | Freq: Four times a day (QID) | ORAL | Status: AC | PRN

## 2011-04-29 MED ORDER — IOHEXOL 300 MG/ML  SOLN
INTRAMUSCULAR | Status: DC | PRN
  Administered 2011-04-29: 10 mL

## 2011-04-29 MED ORDER — LACTATED RINGERS IV SOLN
INTRAVENOUS | Status: DC | PRN
  Administered 2011-04-29: 13:00:00 via INTRAVENOUS

## 2011-04-29 MED ORDER — VALACYCLOVIR HCL 500 MG PO TABS: 1000.0000 mg | ORAL_TABLET | ORAL | Status: DC

## 2011-04-29 MED ORDER — DEXAMETHASONE SODIUM PHOSPHATE 10 MG/ML IJ SOLN
INTRAMUSCULAR | Status: DC | PRN
  Administered 2011-04-29: 10 mg via INTRAVENOUS

## 2011-04-29 MED ORDER — SCOPOLAMINE 1 MG/3DAYS TD PT72
MEDICATED_PATCH | TRANSDERMAL | Status: DC | PRN
  Administered 2011-04-29: 1 via TRANSDERMAL

## 2011-04-29 MED ORDER — POTASSIUM CITRATE ER 10 MEQ (1080 MG) PO TBCR
30.0000 meq | EXTENDED_RELEASE_TABLET | Freq: Two times a day (BID) | ORAL | Status: DC
  Filled 2011-04-29 (×3): qty 3

## 2011-04-29 MED ORDER — OXYCODONE-ACETAMINOPHEN 10-650 MG PO TABS: 1.0000 | ORAL_TABLET | Freq: Four times a day (QID) | ORAL | Status: DC | PRN

## 2011-04-29 MED ORDER — SODIUM CHLORIDE 0.9 % IR SOLN
Status: DC | PRN
  Administered 2011-04-29: 3000 mL

## 2011-04-29 MED ORDER — POTASSIUM CITRATE ER 15 MEQ (1620 MG) PO TBCR: 2.0000 | EXTENDED_RELEASE_TABLET | Freq: Two times a day (BID) | ORAL | Status: DC

## 2011-04-29 MED ORDER — HYDROMORPHONE HCL PF 1 MG/ML IJ SOLN
0.2500 mg | INTRAMUSCULAR | Status: DC | PRN
  Administered 2011-04-29 (×2): 0.25 mg via INTRAVENOUS
  Administered 2011-04-29: 0.5 mg via INTRAVENOUS

## 2011-04-29 MED ORDER — PROMETHAZINE HCL 25 MG/ML IJ SOLN
INTRAMUSCULAR | Status: AC
  Filled 2011-04-29: qty 1

## 2011-04-29 MED ORDER — KETOROLAC TROMETHAMINE 15.75 MG/SPRAY NA SOLN: 1.0000 | Freq: Three times a day (TID) | NASAL | Status: DC | PRN

## 2011-04-29 MED ORDER — PROMETHAZINE HCL 25 MG/ML IJ SOLN
6.2500 mg | INTRAMUSCULAR | Status: DC | PRN
  Administered 2011-04-29: 6.25 mg via INTRAVENOUS

## 2011-04-29 MED ORDER — OXYCODONE HCL 5 MG PO TABS
5.0000 mg | ORAL_TABLET | Freq: Four times a day (QID) | ORAL | Status: DC | PRN
  Administered 2011-04-29: 5 mg via ORAL
  Filled 2011-04-29: qty 1

## 2011-04-29 MED ORDER — URELLE 81 MG PO TABS: 1.0000 | ORAL_TABLET | Freq: Three times a day (TID) | ORAL | Status: DC

## 2011-04-29 MED ORDER — FENTANYL CITRATE 0.05 MG/ML IJ SOLN
INTRAMUSCULAR | Status: DC | PRN
  Administered 2011-04-29 (×2): 50 ug via INTRAVENOUS

## 2011-04-29 MED ORDER — ONDANSETRON HCL 4 MG/2ML IJ SOLN
INTRAMUSCULAR | Status: DC | PRN
  Administered 2011-04-29: 4 mg via INTRAVENOUS

## 2011-04-29 SURGICAL SUPPLY — 17 items
ADAPTER CATH URET PLST 4-6FR (CATHETERS) ×3 IMPLANT
BAG URO CATCHER STRL LF (DRAPE) ×3 IMPLANT
BASKET STNLS GEMINI 4WIRE 3FR (BASKET) ×3 IMPLANT
CATH INTERMIT  6FR 70CM (CATHETERS) ×3 IMPLANT
CLOTH BEACON ORANGE TIMEOUT ST (SAFETY) ×3 IMPLANT
DRAPE CAMERA CLOSED 9X96 (DRAPES) ×3 IMPLANT
GLOVE BIOGEL M STRL SZ7.5 (GLOVE) ×3 IMPLANT
GLOVE SURG SS PI 8.0 STRL IVOR (GLOVE) ×3 IMPLANT
GOWN PREVENTION PLUS XLARGE (GOWN DISPOSABLE) IMPLANT
GOWN STRL NON-REIN LRG LVL3 (GOWN DISPOSABLE) ×3 IMPLANT
GOWN STRL REIN XL XLG (GOWN DISPOSABLE) ×3 IMPLANT
GUIDEWIRE STR DUAL SENSOR (WIRE) ×3 IMPLANT
MANIFOLD NEPTUNE II (INSTRUMENTS) ×3 IMPLANT
NS IRRIG 1000ML POUR BTL (IV SOLUTION) ×3 IMPLANT
PACK CYSTO (CUSTOM PROCEDURE TRAY) ×3 IMPLANT
SCRUB PCMX 4 OZ (MISCELLANEOUS) ×3 IMPLANT
TUBING CONNECTING 10 (TUBING) ×3 IMPLANT

## 2011-04-29 NOTE — Anesthesia Postprocedure Evaluation (Signed)
  Anesthesia Post-op Note  Patient: Meghan Frank  Procedure(s) Performed:  CYSTOSCOPY/RETROGRADE/URETEROSCOPY/STONE EXTRACTION WITH BASKET - bilateral retrogrades, bilateral ureteroscopy, left stone basketry  Patient Location: PACU  Anesthesia Type: General  Level of Consciousness: oriented and sedated  Airway and Oxygen Therapy: Patient Spontanous Breathing and Patient connected to nasal cannula oxygen  Post-op Pain: mild  Post-op Assessment: Post-op Vital signs reviewed, Patient's Cardiovascular Status Stable, Respiratory Function Stable and Patent Airway  Post-op Vital Signs: stable  Complications: No apparent anesthesia complications

## 2011-04-29 NOTE — Progress Notes (Signed)
PT. CAME BACK FROM SURGERY, ASSESSMENT IS THE SAME AS BEFORE, NO INCISIONS ALL ARE INTERNAL, PT. WILL BE D/C HOME THIS PM PER MD. ORDERS.

## 2011-04-29 NOTE — Anesthesia Preprocedure Evaluation (Signed)
Anesthesia Evaluation  Patient identified by MRN, date of birth, ID band Patient awake    Reviewed: Allergy & Precautions, H&P , NPO status , Patient's Chart, lab work & pertinent test results, reviewed documented beta blocker date and time   Airway Mallampati: I TM Distance: >3 FB Neck ROM: Full    Dental   Pulmonary neg pulmonary ROS,  clear to auscultation        Cardiovascular neg cardio ROS Regular Normal Denies cardiac symptoms    Neuro/Psych Negative Neurological ROS  Negative Psych ROS   GI/Hepatic negative GI ROS, Neg liver ROS,   Endo/Other  Negative Endocrine ROS  Renal/GU Kidney stone  Genitourinary negative   Musculoskeletal negative musculoskeletal ROS (+)   Abdominal   Peds negative pediatric ROS (+)  Hematology negative hematology ROS (+)   Anesthesia Other Findings   Reproductive/Obstetrics negative OB ROS                           Anesthesia Physical Anesthesia Plan  ASA: I  Anesthesia Plan: General   Post-op Pain Management:    Induction: Intravenous  Airway Management Planned: LMA  Additional Equipment:   Intra-op Plan:   Post-operative Plan: Extubation in OR  Informed Consent: I have reviewed the patients History and Physical, chart, labs and discussed the procedure including the risks, benefits and alternatives for the proposed anesthesia with the patient or authorized representative who has indicated his/her understanding and acceptance.     Plan Discussed with: CRNA and Surgeon  Anesthesia Plan Comments:         Anesthesia Quick Evaluation

## 2011-04-29 NOTE — Op Note (Signed)
Preoperative diagnosis: Bilateral ureteral calculi, with 4 mm left lower ureteral stone, and 2 mm right upper ureteral stone.  Postoperative diagnosis: Same  Operation: Cystoscopy ureteroscopy, bilateral retrograde pyelogram with interpretation, bilateral ureteroscopy, stone extraction, 4 mm left lower ureteral stone.  Surgeon: Makenley Shimp  Anesthesia: Gen. LMA  Complications: None  Estimated blood loss: None  Preparation: After appropriate pre-anesthesia and after appropriate timeout, the patient was placed on the operating table, where she underwent general LMA anesthesia and she was replaced in the dorsal laparotomy position, prepped with Betadine solution, and draped in usual fashion.  Cystourethroscopy was accomplished, which showed normal bladder, normal urethra. There was no bladder stone, tumor, or diverticular formation.  Left retrograde Polygram was performed, which showed stone in the left lower ureter. A guidewire measuring 0.038 was then placed in the left renal pelvis. The short ureteroscope was placed through the orifice into the lower ureter, and the stone was identified and photographed. The stone appeared to be rectangular in formation, with 3 "fingers" emanating from it. A basket was placed around the stone [facility]), and the stone was extracted.  Right retrograde pyelogram was performed, which appeared to be normal. Right ureteroscopy was performed, because of the patient's recent CT scan showing a 2 mm calculus in the right upper ureter. No stone was identified, although there was a small mucous flake identified in the upper ureter, which was removed. The ureteroscope was passed into the renal pelvis, and no stone was identified. Therefore, the ureteroscope was slowly removed with no stone identified, and the guidewires were removed. The patient was given IV Toradol, and awakened, and taken to recovery room in good condition.

## 2011-04-29 NOTE — Progress Notes (Addendum)
  Subjective: Patient reports tolerating PO medication for pain. Denies passing any stone material.  Objective: Vital signs in last 24 hours: Temp:  [98.3 F (36.8 C)-98.5 F (36.9 C)] 98.3 F (36.8 C) (11/23 0640) Pulse Rate:  [64-94] 64  (11/23 0640) Resp:  [18] 18  (11/23 0640) BP: (110-144)/(67-85) 110/67 mmHg (11/23 0640) SpO2:  [98 %-99 %] 99 % (11/23 0640) Weight:  [65.998 kg (145 lb 8 oz)] 145 lb 8 oz (65.998 kg) (11/22 1910)  Intake/Output from previous day: 11/22 0701 - 11/23 0700 In: 100 [IV Piggyback:100] Out: 150 [Urine:150] Intake/Output this shift:    Physical Exam:  General:alert, cooperative and no distress GI: soft Female genitalia: not done Resp: clear to auscultation bilaterally Cardio: regular rate and rhythm, S1, S2 normal, no murmur, click, rub or gallop  Lab Results:  Basename 04/28/11 2257  HGB 11.0*  HCT 31.0*   BMET  Basename 04/28/11 2257  NA 136  K 3.6  CL 104  CO2 24  GLUCOSE 111*  BUN 19  CREATININE 0.84  CALCIUM 9.2   No results found for this basename: LABPT:3,INR:3 in the last 72 hours No results found for this basename: LABURIN:1 in the last 72 hours No results found for this or any previous visit.  Studies/Results: No results found.  Assessment/Plan: Kidney Stones  Scheduled today for cystouerterscopy, B RPG, stone extraction by Dr. Patsi Sears    LOS: 1 day   [facility] 04/29/2011, 8:29 AM    Pt seen and examined and is in need of surgery to remove stones. No change in H/P. Expectation: Excellent. Possible need for stent.

## 2011-04-29 NOTE — Transfer of Care (Signed)
Immediate Anesthesia Transfer of Care Note  Patient: Meghan Frank  Procedure(s) Performed:  CYSTOSCOPY/RETROGRADE/URETEROSCOPY/STONE EXTRACTION WITH BASKET - bilateral retrogrades, bilateral ureteroscopy, left stone basketry  Patient Location: PACU  Anesthesia Type: General  Level of Consciousness: awake, alert , oriented and patient cooperative  Airway & Oxygen Therapy: Patient Spontanous Breathing and Patient connected to face mask oxygen  Post-op Assessment: Report given to PACU RN and Post -op Vital signs reviewed and stable  Post vital signs: Reviewed and stable  Complications: No apparent anesthesia complications

## 2011-05-02 NOTE — Discharge Summary (Signed)
Physician Discharge Summary  Patient ID: Meghan Frank MRN: [id number] DOB/AGE: September 07, 1965 45 y.o.  Admit date: 04/28/2011 Discharge date: 05/02/2011  Admission Diagnoses: Bilateral ureteral stones  Discharge Diagnoses:  Active Problems:  * No active hospital problems. *    Discharged Condition: good  Hospital Course: surgery and discharge as op.  Consults: none  Significant Diagnostic Studies: No results found.  Treatments: surgery: stone extraction.  Discharge Exam: Blood pressure 124/84, pulse 65, temperature 98.4 F (36.9 C), temperature source Oral, resp. rate 12, height 5\' 4"  (1.626 m), weight 65.998 kg (145 lb 8 oz), SpO2 100.00%. General appearance: alert, cooperative, appears stated age and fatigued  Disposition: Home or Self Care  Discharge Orders    Future Orders Please Complete By Expires   Diet - low sodium heart healthy      Increase activity slowly        Discharge Medication List as of 04/29/2011  4:43 PM    START taking these medications   Details  !! hydrocodone-acetaminophen (LORCET PLUS) 7.5-650 MG per tablet Take 1 tablet by mouth every 6 (six) hours as needed for pain., Starting 04/29/2011, Until Mon 05/09/11, Print    !! hydrocodone-acetaminophen (LORCET PLUS) 7.5-650 MG per tablet Take 1 tablet by mouth every 6 (six) hours as needed for pain., Starting 04/29/2011, Until Mon 05/09/11, Print    URELLE (URELLE/URISED) 81 MG TABS Take 1 tablet (81 mg total) by mouth 3 (three) times daily., Starting 04/29/2011, Until Discontinued, Print     !! - Potential duplicate medications found. Please discuss with provider.    CONTINUE these medications which have CHANGED   Details  !! Potassium Citrate (UROCIT-K 15) 15 MEQ (1620 MG) TBCR Take 2 tablets by mouth 2 (two) times daily., Starting 04/29/2011, Until Discontinued, Print     !! - Potential duplicate medications found. Please discuss with provider.    CONTINUE these medications which have NOT  CHANGED   Details  oxyCODONE-acetaminophen (PERCOCET) 10-650 MG per tablet Take 1 tablet by mouth every 6 (six) hours as needed.  , Until Discontinued, Historical Med    !! Potassium Citrate (UROCIT-K 15) 15 MEQ (1620 MG) TBCR Take 2 tablets by mouth 2 (two) times daily.  , Until Discontinued, Historical Med    Tamsulosin HCl (FLOMAX) 0.4 MG CAPS Take 0.4 mg by mouth daily.  , Until Discontinued, Historical Med    valACYclovir (VALTREX) 1000 MG tablet Take 1,000-2,000 mg by mouth as directed. For outbreak, take 1 tablet twice daily for 3 days, then 1 tablet once daily until fever blister is gone , Until Discontinued, Historical Med     !! - Potential duplicate medications found. Please discuss with provider.    STOP taking these medications     acetaminophen (TYLENOL) 325 MG tablet      Ketorolac Tromethamine (SPRIX) 15.75 MG/SPRAY SOLN      traMADol (ULTRAM) 50 MG tablet        Follow-up Information    Follow up with Jethro Bolus I, MD. Call in 5 days. (follow-up with Jetta Lout NP)    Contact information:   [address] [place] [facility] [place] [address] [phone]          Signed: Jethro Bolus I 05/02/2011, 12:07 PM

## 2011-05-03 ENCOUNTER — Encounter (HOSPITAL_COMMUNITY): Payer: Self-pay | Admitting: Urology

## 2011-08-25 ENCOUNTER — Encounter: Payer: Self-pay | Admitting: Internal Medicine

## 2011-08-25 ENCOUNTER — Ambulatory Visit (INDEPENDENT_AMBULATORY_CARE_PROVIDER_SITE_OTHER): Payer: BC Managed Care – PPO | Admitting: Internal Medicine

## 2011-08-25 VITALS — BP 110/70 | HR 72 | Wt 141.0 lb

## 2011-08-25 DIAGNOSIS — Z87442 Personal history of urinary calculi: Secondary | ICD-10-CM

## 2011-08-25 DIAGNOSIS — R4589 Other symptoms and signs involving emotional state: Secondary | ICD-10-CM

## 2011-08-25 DIAGNOSIS — R5381 Other malaise: Secondary | ICD-10-CM

## 2011-08-25 DIAGNOSIS — G478 Other sleep disorders: Secondary | ICD-10-CM

## 2011-08-25 DIAGNOSIS — R5383 Other fatigue: Secondary | ICD-10-CM

## 2011-08-25 DIAGNOSIS — G479 Sleep disorder, unspecified: Secondary | ICD-10-CM

## 2011-08-25 DIAGNOSIS — F99 Mental disorder, not otherwise specified: Secondary | ICD-10-CM

## 2011-08-25 LAB — T4, FREE: Free T4: 0.83 ng/dL (ref 0.60–1.60)

## 2011-08-25 LAB — MAGNESIUM: Magnesium: 2 mg/dL (ref 1.5–2.5)

## 2011-08-25 LAB — BASIC METABOLIC PANEL
BUN: 11 mg/dL (ref 6–23)
Creatinine, Ser: 0.7 mg/dL (ref 0.4–1.2)
GFR: 104.83 mL/min (ref >60.00)
Potassium: 4 mEq/L (ref 3.5–5.1)

## 2011-08-25 LAB — URIC ACID: Uric Acid, Serum: 4 mg/dL (ref 2.4–7.0)

## 2011-08-25 LAB — TSH: TSH: 1.08 u[IU]/mL (ref 0.35–5.50)

## 2011-08-25 LAB — T3, FREE: T3, Free: 2.8 pg/mL (ref 2.3–4.2)

## 2011-08-25 LAB — PHOSPHORUS: Phosphorus: 3 mg/dL (ref 2.3–4.6)

## 2011-08-25 NOTE — Patient Instructions (Addendum)
Will notify you  of labs when available. Then plan follow up as appropriate .   Results to your other doctors.  Insomnia Insomnia is frequent trouble falling and/or staying asleep. Insomnia can be a long term problem or a short term problem. Both are common. Insomnia can be a short term problem when the wakefulness is related to a certain stress or worry. Long term insomnia is often related to ongoing stress during waking hours and/or poor sleeping habits. Overtime, sleep deprivation itself can make the problem worse. Every little thing feels more severe because you are overtired and your ability to cope is decreased. CAUSES   Stress, anxiety, and depression.   Poor sleeping habits.   Distractions such as TV in the bedroom.   Naps close to bedtime.   Engaging in emotionally charged conversations before bed.   Technical reading before sleep.   Alcohol and other sedatives. They may make the problem worse. They can hurt normal sleep patterns and normal dream activity.   Stimulants such as caffeine for several hours prior to bedtime.   Pain syndromes and shortness of breath can cause insomnia.   Exercise late at night.   Changing time zones may cause sleeping problems (jet lag).  It is sometimes helpful to have someone observe your sleeping patterns. They should look for periods of not breathing during the night (sleep apnea). They should also look to see how long those periods last. If you live alone or observers are uncertain, you can also be observed at a sleep clinic where your sleep patterns will be professionally monitored. Sleep apnea requires a checkup and treatment. Give your caregivers your medical history. Give your caregivers observations your family has made about your sleep.  SYMPTOMS   Not feeling rested in the morning.   Anxiety and restlessness at bedtime.   Difficulty falling and staying asleep.  TREATMENT   Your caregiver may prescribe treatment for an  underlying medical disorders. Your caregiver can give advice or help if you are using alcohol or other drugs for self-medication. Treatment of underlying problems will usually eliminate insomnia problems.   Medications can be prescribed for short time use. They are generally not recommended for lengthy use.   Over-the-counter sleep medicines are not recommended for lengthy use. They can be habit forming.   You can promote easier sleeping by making lifestyle changes such as:   Using relaxation techniques that help with breathing and reduce muscle tension.   Exercising earlier in the day.   Changing your diet and the time of your last meal. No night time snacks.   Establish a regular time to go to bed.   Counseling can help with stressful problems and worry.   Soothing music and white noise may be helpful if there are background noises you cannot remove.   Stop tedious detailed work at least one hour before bedtime.  HOME CARE INSTRUCTIONS   Keep a diary. Inform your caregiver about your progress. This includes any medication side effects. See your caregiver regularly. Take note of:   Times when you are asleep.   Times when you are awake during the night.   The quality of your sleep.   How you feel the next day.  This information will help your caregiver care for you.  Get out of bed if you are still awake after 15 minutes. Read or do some quiet activity. Keep the lights down. Wait until you feel sleepy and go back to bed.   Keep  regular sleeping and waking hours. Avoid naps.   Exercise regularly.   Avoid distractions at bedtime. Distractions include watching television or engaging in any intense or detailed activity like attempting to balance the household checkbook.   Develop a bedtime ritual. Keep a familiar routine of bathing, brushing your teeth, climbing into bed at the same time each night, listening to soothing music. Routines increase the success of falling to sleep  faster.   Use relaxation techniques. This can be using breathing and muscle tension release routines. It can also include visualizing peaceful scenes. You can also help control troubling or intruding thoughts by keeping your mind occupied with boring or repetitive thoughts like the old concept of counting sheep. You can make it more creative like imagining planting one beautiful flower after another in your backyard garden.   During your day, work to eliminate stress. When this is not possible use some of the previous suggestions to help reduce the anxiety that accompanies stressful situations.  MAKE SURE YOU:   Understand these instructions.   Will watch your condition.   Will get help right away if you are not doing well or get worse.  Document Released: 05/20/2000 Document Revised: 05/12/2011 Document Reviewed: 06/20/2007 [facility] Patient Information 2012 [place], [place].

## 2011-08-25 NOTE — Progress Notes (Signed)
Subjective:    Patient ID: Meghan Frank, female    DOB: [birth year], 46 y.o.   MRN: [id number]  HPI Patient comes in because of couple of issues. Hx of large 4 x 6 mm renal stone that needed a retrieval procedure . Since that time she is on potassium citrate twice a day she saw the urologist yesterday was told that her PTH level is still not in the right range. tolow Had gyne appt. recently that was okay she has been having moodiness crying at unusual triggers and was placed on a low dose Prozac.  She had an ablation and therefore is unable to tell if it's related to her periods but did have PMS type symptoms in the past. Taking calcium supp Weight loss and exercising  .     Review of Systems Neg fever   Cp sob cough syncope .  Past history family history social history reviewed in the electronic medical record. Outpatient Encounter Prescriptions as of 08/25/2011  Medication Sig Dispense Refill  . FLUoxetine (PROZAC) 10 MG capsule Take 10 mg by mouth daily.       . Potassium Citrate 15 MEQ (1620 MG) TBCR Take 1 tablet by mouth 2 (two) times daily.      . valACYclovir (VALTREX) 1000 MG tablet Take 1,000-2,000 mg by mouth as directed. For outbreak, take 1 tablet twice daily for 3 days, then 1 tablet once daily until fever blister is gone       . DISCONTD: Potassium Citrate (UROCIT-K 15) 15 MEQ (1620 MG) TBCR Take 2 tablets by mouth 2 (two) times daily.  120 tablet  11  . DISCONTD: oxyCODONE-acetaminophen (PERCOCET) 10-650 MG per tablet Take 1 tablet by mouth every 6 (six) hours as needed.        Marland Kitchen DISCONTD: Potassium Citrate (UROCIT-K 15) 15 MEQ (1620 MG) TBCR Take 2 tablets by mouth 2 (two) times daily.        Marland Kitchen DISCONTD: Tamsulosin HCl (FLOMAX) 0.4 MG CAPS Take 0.4 mg by mouth daily.        Marland Kitchen DISCONTD: URELLE (URELLE/URISED) 81 MG TABS Take 1 tablet (81 mg total) by mouth 3 (three) times daily.  30 each  2        Objective:   Physical Exam  BP 110/70  Pulse 72  Wt 141 lb (63.957  kg)  Physical Exam: Vital signs reviewed BMW:UXLK is a well-developed well-nourished alert cooperative  white female who appears her stated age in no acute distress.  HEENT: normocephalic atraumatic , Eyes: PERRL EOM's full, conjunctiva clear,  Slight stare?Nares: paten,t no deformity discharge or tenderness., Ears: no deformity EAC's clear TMs with normal landmarks. Mouth: clear OP, no lesions, edema.  Moist mucous membranes. Dentition in adequate repair. NECK: supple without masses, thyromegaly or bruits. CHEST/PULM:  Clear to auscultation and percussion breath sounds equal no wheeze , rales or rhonchi. No chest wall deformities or tenderness. CV: PMI is nondisplaced, S1 S2 no gallops, murmurs, rubs. Peripheral pulses are full without delay.No JVD .  ABDOMEN: Bowel sounds normal nontender  No guard or rebound, no hepato splenomegal no CVA tenderness. Extremtities:  No clubbing cyanosis or edema, no acute joint swelling or redness no focal atrophy NEURO:  Oriented x3, cranial nerves 3-12 appear to be intact, no obvious focal weakness,gait within normal limits SKIN: No acute rashes normal turgor, color, no bruising or petechiae. PSYCH: Oriented, good eye contact, no obvious depression anxiety, cognition and judgment appear normal. LN: no cervical  adenopathy  Assessment & Plan:  Hx of renal stones under rx  Fatigue and moodiness  Poss hormonal r/o metabolic  Reviewed EHR and no recent labs  Of import.   Will check pth vit d  tfts etc.   Work on sleep hygiene Total visit > 50% spent counseling and coordinating care

## 2011-08-29 LAB — PTH, INTACT AND CALCIUM: PTH: 29.4 pg/mL (ref 14.0–72.0)

## 2011-08-31 ENCOUNTER — Encounter: Payer: Self-pay | Admitting: *Deleted

## 2011-08-31 ENCOUNTER — Telehealth: Payer: Self-pay | Admitting: Internal Medicine

## 2011-08-31 NOTE — Telephone Encounter (Signed)
Pt needs blood work result °

## 2011-09-01 NOTE — Telephone Encounter (Signed)
Pt aware of lab results 

## 2011-11-22 ENCOUNTER — Other Ambulatory Visit: Payer: Self-pay | Admitting: Orthopedic Surgery

## 2011-11-24 ENCOUNTER — Encounter (HOSPITAL_BASED_OUTPATIENT_CLINIC_OR_DEPARTMENT_OTHER): Payer: Self-pay | Admitting: *Deleted

## 2011-11-24 NOTE — H&P (Signed)
  Meghan Frank is an 46 y.o. female.   Chief Complaint:c/o of mass, volar aspect, right hand HPI: Patient is a 46 year old right-hand-dominant female who presented to our office in November of 2012 complaining of a mass, volar aspect of her right hand. She states had been there for approximately 4-5 months. She did not recall any antecedent injury to the hand. She states that this causes her occasional pain, especially with direct pressure. After lengthy discussion and consultation in the office. It was decided that she would be best served by undergoing excisional biopsy of the mass.  Past Medical History  Diagnosis Date  . Recurrent herpes labialis   . Recurrent nephrolithiasis   . Complication of anesthesia     "doesn't wake up well"  . PONV (postoperative nausea and vomiting)   . No pertinent past medical history     Past Surgical History  Procedure Date  . Cholecystectomy 2008  . Tubal ligation 2000  . Laser ablation 2010    uterine ablation  . Lithotripsy 2008  . Mandible surgery 1985  . Cystoscopy/retrograde/ureteroscopy/stone extraction with basket 04/29/2011    Procedure: CYSTOSCOPY/RETROGRADE/URETEROSCOPY/STONE EXTRACTION WITH BASKET;  Surgeon: Kathi Ludwig, MD;  Location: [facility];  Service: Urology;  Laterality: Bilateral;  bilateral retrogrades, bilateral ureteroscopy, left stone basketry    Family History  Problem Relation Age of Onset  . Coronary artery disease    . Diabetes    . Hyperlipidemia     Social History:  reports that she quit smoking about 6 months ago. Her smoking use included Cigarettes. She has never used smokeless tobacco. She reports that she does not drink alcohol or use illicit drugs.  Allergies:  Allergies  Allergen Reactions  . Bee Venom Other (See Comments)    Patient receives antibiotic cream for infection due to sting  . Sulfonamide Derivatives Rash    No prescriptions prior to admission    No results found for this or any  previous visit (from the past 48 hour(s)).  No results found.   Pertinent items are noted in HPI.  Last menstrual period 11/23/2009.  General appearance: alert Head: Normocephalic, without obvious abnormality Neck: supple, symmetrical, trachea midline Resp: clear to auscultation bilaterally Cardio: regular rate and rhythm GI: normal findings: bowel sounds normal Extremities: Examination of her hand reveals a 2 x 2 cm mass, volar aspect of her right hand between the index and long finger metacarpal heads. She has full range of motion of her fingers and wrist. Neurovascular status is intact. She has pain with direct pressure. Pulses: 2+ and symmetric Skin: normal Neurologic: Grossly normal    Assessment/Plan Impression: Enlarging mass, volar aspect, right palm  Plan: Patient be taken to the operating room to undergo excisional biopsy of the mass of the right palm. The procedure risks, benefits, and postoperative course were discussed with patient at length and she was in agreement with this plan.  DASNOIT,Sherene Plancarte J 11/24/2011, 5:57 PM    H&P documentation: 11/25/2011  -History and Physical Reviewed  -Patient has been re-examined  -No change in the plan of care  Meghan Forster, MD

## 2011-11-24 NOTE — Progress Notes (Signed)
No labs needed Arrive 6am

## 2011-11-25 ENCOUNTER — Encounter (HOSPITAL_BASED_OUTPATIENT_CLINIC_OR_DEPARTMENT_OTHER): Payer: Self-pay | Admitting: Anesthesiology

## 2011-11-25 ENCOUNTER — Ambulatory Visit (HOSPITAL_BASED_OUTPATIENT_CLINIC_OR_DEPARTMENT_OTHER): Payer: BC Managed Care – PPO | Admitting: Anesthesiology

## 2011-11-25 ENCOUNTER — Encounter (HOSPITAL_BASED_OUTPATIENT_CLINIC_OR_DEPARTMENT_OTHER): Payer: Self-pay

## 2011-11-25 ENCOUNTER — Ambulatory Visit (HOSPITAL_BASED_OUTPATIENT_CLINIC_OR_DEPARTMENT_OTHER)
Admission: RE | Admit: 2011-11-25 | Discharge: 2011-11-25 | Disposition: A | Discharge status: Home / Self Care | Payer: BC Managed Care – PPO | Source: Ambulatory Visit | Attending: Orthopedic Surgery | Admitting: Orthopedic Surgery

## 2011-11-25 ENCOUNTER — Encounter (HOSPITAL_BASED_OUTPATIENT_CLINIC_OR_DEPARTMENT_OTHER)
Admission: RE | Disposition: A | Discharge status: Home / Self Care | Payer: Self-pay | Source: Ambulatory Visit | Attending: Orthopedic Surgery

## 2011-11-25 DIAGNOSIS — D2110 Benign neoplasm of connective and other soft tissue of unspecified upper limb, including shoulder: Secondary | ICD-10-CM | POA: Insufficient documentation

## 2011-11-25 HISTORY — PX: MASS EXCISION: SHX2000

## 2011-11-25 LAB — POCT HEMOGLOBIN-HEMACUE: Hemoglobin: 12.2 g/dL (ref 12.0–15.0)

## 2011-11-25 SURGERY — EXCISION MASS
Anesthesia: General | Site: Hand | Laterality: Right | Wound class: Clean

## 2011-11-25 MED ORDER — HYDROMORPHONE HCL PF 1 MG/ML IJ SOLN: 0.2500 mg | INTRAMUSCULAR | Status: DC | PRN

## 2011-11-25 MED ORDER — LIDOCAINE HCL (CARDIAC) 20 MG/ML IV SOLN
INTRAVENOUS | Status: DC | PRN
  Administered 2011-11-25: 50 mg via INTRAVENOUS

## 2011-11-25 MED ORDER — DEXAMETHASONE SODIUM PHOSPHATE 4 MG/ML IJ SOLN
INTRAMUSCULAR | Status: DC | PRN
  Administered 2011-11-25: 8 mg via INTRAVENOUS

## 2011-11-25 MED ORDER — LACTATED RINGERS IV SOLN
INTRAVENOUS | Status: DC
  Administered 2011-11-25 (×2): via INTRAVENOUS

## 2011-11-25 MED ORDER — LIDOCAINE HCL (PF) 2 % IJ SOLN
INTRAMUSCULAR | Status: DC | PRN
  Administered 2011-11-25: 4 mL

## 2011-11-25 MED ORDER — ONDANSETRON HCL 4 MG/2ML IJ SOLN
INTRAMUSCULAR | Status: DC | PRN
  Administered 2011-11-25: 4 mg via INTRAVENOUS

## 2011-11-25 MED ORDER — HYDROCODONE-ACETAMINOPHEN 5-325 MG PO TABS
ORAL_TABLET | ORAL | Status: AC
Start: 2011-11-25 — End: 2011-12-05

## 2011-11-25 MED ORDER — PROPOFOL 10 MG/ML IV EMUL
INTRAVENOUS | Status: DC | PRN
  Administered 2011-11-25: 150 mg via INTRAVENOUS

## 2011-11-25 MED ORDER — DROPERIDOL 2.5 MG/ML IJ SOLN
INTRAMUSCULAR | Status: DC | PRN
  Administered 2011-11-25: 0.625 mg via INTRAVENOUS

## 2011-11-25 MED ORDER — CHLORHEXIDINE GLUCONATE 4 % EX LIQD: 60.0000 mL | Freq: Once | CUTANEOUS | Status: DC

## 2011-11-25 MED ORDER — FENTANYL CITRATE 0.05 MG/ML IJ SOLN
INTRAMUSCULAR | Status: DC | PRN
  Administered 2011-11-25 (×2): 50 ug via INTRAVENOUS

## 2011-11-25 SURGICAL SUPPLY — 51 items
BANDAGE ADHESIVE 1X3 (GAUZE/BANDAGES/DRESSINGS) IMPLANT
BANDAGE ELASTIC 3 VELCRO ST LF (GAUZE/BANDAGES/DRESSINGS) IMPLANT
BLADE MINI RND TIP GREEN BEAV (BLADE) IMPLANT
BLADE SURG 15 STRL LF DISP TIS (BLADE) ×1 IMPLANT
BLADE SURG 15 STRL SS (BLADE) ×1
BNDG COHESIVE 1X5 TAN STRL LF (GAUZE/BANDAGES/DRESSINGS) ×2 IMPLANT
BNDG ELASTIC 2 VLCR STRL LF (GAUZE/BANDAGES/DRESSINGS) ×2 IMPLANT
BNDG ESMARK 4X9 LF (GAUZE/BANDAGES/DRESSINGS) ×2 IMPLANT
BRUSH SCRUB EZ PLAIN DRY (MISCELLANEOUS) ×2 IMPLANT
CLOTH BEACON ORANGE TIMEOUT ST (SAFETY) ×2 IMPLANT
CORDS BIPOLAR (ELECTRODE) ×2 IMPLANT
COVER MAYO STAND STRL (DRAPES) ×2 IMPLANT
COVER TABLE BACK 60X90 (DRAPES) ×2 IMPLANT
CUFF TOURNIQUET SINGLE 18IN (TOURNIQUET CUFF) ×2 IMPLANT
DECANTER SPIKE VIAL GLASS SM (MISCELLANEOUS) IMPLANT
DRAIN PENROSE 1/2X12 LTX STRL (WOUND CARE) IMPLANT
DRAIN PENROSE 1/4X12 LTX STRL (WOUND CARE) IMPLANT
DRAPE EXTREMITY T 121X128X90 (DRAPE) ×2 IMPLANT
DRAPE SURG 17X23 STRL (DRAPES) ×2 IMPLANT
GAUZE XEROFORM 1X8 LF (GAUZE/BANDAGES/DRESSINGS) IMPLANT
GLOVE BIOGEL PI IND STRL 7.0 (GLOVE) ×1 IMPLANT
GLOVE BIOGEL PI IND STRL 8 (GLOVE) ×1 IMPLANT
GLOVE BIOGEL PI INDICATOR 7.0 (GLOVE) ×1
GLOVE BIOGEL PI INDICATOR 8 (GLOVE) ×1
GLOVE ECLIPSE 7.0 STRL STRAW (GLOVE) ×2 IMPLANT
GLOVE EXAM NITRILE MD LF STRL (GLOVE) ×2 IMPLANT
GLOVE ORTHO TXT STRL SZ7.5 (GLOVE) ×2 IMPLANT
GOWN PREVENTION PLUS XLARGE (GOWN DISPOSABLE) ×4 IMPLANT
GOWN STRL REIN XL XLG (GOWN DISPOSABLE) ×4 IMPLANT
NEEDLE 27GAX1X1/2 (NEEDLE) ×2 IMPLANT
NEEDLE BLUNT 17GA (NEEDLE) ×2 IMPLANT
PACK BASIN DAY SURGERY FS (CUSTOM PROCEDURE TRAY) ×2 IMPLANT
PAD CAST 3X4 CTTN HI CHSV (CAST SUPPLIES) IMPLANT
PADDING CAST COTTON 3X4 STRL (CAST SUPPLIES)
PADDING UNDERCAST 2  STERILE (CAST SUPPLIES) IMPLANT
SPLINT PLASTER CAST XFAST 3X15 (CAST SUPPLIES) IMPLANT
SPLINT PLASTER XTRA FASTSET 3X (CAST SUPPLIES)
SPONGE GAUZE 4X4 12PLY (GAUZE/BANDAGES/DRESSINGS) IMPLANT
STOCKINETTE 4X48 STRL (DRAPES) ×2 IMPLANT
STRIP CLOSURE SKIN 1/2X4 (GAUZE/BANDAGES/DRESSINGS) IMPLANT
SUT ETHILON 5 0 P 3 18 (SUTURE) ×1
SUT MERSILENE 4 0 P 3 (SUTURE) IMPLANT
SUT NYLON ETHILON 5-0 P-3 1X18 (SUTURE) ×1 IMPLANT
SUT PROLENE 3 0 PS 2 (SUTURE) ×2 IMPLANT
SYR 20CC LL (SYRINGE) IMPLANT
SYR 3ML 23GX1 SAFETY (SYRINGE) IMPLANT
SYR CONTROL 10ML LL (SYRINGE) ×2 IMPLANT
TOWEL OR 17X24 6PK STRL BLUE (TOWEL DISPOSABLE) ×2 IMPLANT
TRAY DSU PREP LF (CUSTOM PROCEDURE TRAY) ×2 IMPLANT
UNDERPAD 30X30 INCONTINENT (UNDERPADS AND DIAPERS) ×2 IMPLANT
WATER STERILE IRR 1000ML POUR (IV SOLUTION) IMPLANT

## 2011-11-25 NOTE — Brief Op Note (Signed)
11/25/2011  8:08 AM  PATIENT:  Meghan Frank  46 y.o. 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

## 2011-11-25 NOTE — Op Note (Signed)
[id number] 

## 2011-11-25 NOTE — Anesthesia Preprocedure Evaluation (Addendum)
Anesthesia Evaluation  Patient identified by MRN, date of birth, ID band Patient awake    Reviewed: Allergy & Precautions, H&P , NPO status , Patient's Chart, lab work & pertinent test results  History of Anesthesia Complications (+) PONV  Airway Mallampati: I TM Distance: >3 FB Neck ROM: Full    Dental No notable dental hx. (+) Teeth Intact and Dental Advisory Given   Pulmonary neg pulmonary ROS,  breath sounds clear to auscultation  Pulmonary exam normal       Cardiovascular negative cardio ROS  Rhythm:Regular Rate:Normal     Neuro/Psych negative neurological ROS  negative psych ROS   GI/Hepatic negative GI ROS, Neg liver ROS,   Endo/Other  negative endocrine ROS  Renal/GU negative Renal ROS  negative genitourinary   Musculoskeletal   Abdominal   Peds  Hematology negative hematology ROS (+)   Anesthesia Other Findings   Reproductive/Obstetrics negative OB ROS                           Anesthesia Physical Anesthesia Plan  ASA: I  Anesthesia Plan: General   Post-op Pain Management:    Induction: Intravenous  Airway Management Planned: LMA  Additional Equipment:   Intra-op Plan:   Post-operative Plan: Extubation in OR  Informed Consent: I have reviewed the patients History and Physical, chart, labs and discussed the procedure including the risks, benefits and alternatives for the proposed anesthesia with the patient or authorized representative who has indicated his/her understanding and acceptance.   Dental advisory given  Plan Discussed with: CRNA  Anesthesia Plan Comments:        Anesthesia Quick Evaluation

## 2011-11-25 NOTE — Transfer of Care (Signed)
Immediate Anesthesia Transfer of Care Note  Patient: Meghan Frank  Procedure(s) Performed: Procedure(s) (LRB): EXCISION MASS (Right)  Patient Location: PACU  Anesthesia Type: General  Level of Consciousness: awake  Airway & Oxygen Therapy: Patient Spontanous Breathing and Patient connected to face mask oxygen  Post-op Assessment: Report given to PACU RN and Post -op Vital signs reviewed and stable  Post vital signs: Reviewed and stable  Complications: No apparent anesthesia complications

## 2011-11-25 NOTE — Anesthesia Procedure Notes (Signed)
Procedure Name: LMA Insertion Performed by: Lance Coon Pre-anesthesia Checklist: Patient identified, Timeout performed, Emergency Drugs available, Suction available and Patient being monitored Patient Re-evaluated:Patient Re-evaluated prior to inductionOxygen Delivery Method: Circle system utilized Preoxygenation: Pre-oxygenation with 100% oxygen Intubation Type: IV induction Ventilation: Mask ventilation without difficulty LMA: LMA inserted LMA Size: 3.0 Number of attempts: 1 Placement Confirmation: breath sounds checked- equal and bilateral Dental Injury: Teeth and Oropharynx as per pre-operative assessment

## 2011-11-25 NOTE — Anesthesia Postprocedure Evaluation (Signed)
  Anesthesia Post-op Note  Patient: Meghan Frank  Procedure(s) Performed: Procedure(s) (LRB): EXCISION MASS (Right)  Patient Location: PACU  Anesthesia Type: General  Level of Consciousness: awake, alert  and oriented  Airway and Oxygen Therapy: Patient Spontanous Breathing  Post-op Pain: none  Post-op Assessment: Post-op Vital signs reviewed, Patient's Cardiovascular Status Stable, Respiratory Function Stable, Patent Airway and No signs of Nausea or vomiting  Post-op Vital Signs: Reviewed and stable  Complications: No apparent anesthesia complications

## 2011-11-25 NOTE — Op Note (Signed)
NAMESANTANNA, [name]               ACCOUNT NO.:  [id number]  MEDICAL RECORD NO.:  [id number]  LOCATION:                                 FACILITY:  PHYSICIAN:  Katy Fitch. Bow Buntyn, M.D.      DATE OF BIRTH:  DATE OF PROCEDURE:  11/25/2011 DATE OF DISCHARGE:                              OPERATIVE REPORT   PREOPERATIVE DIAGNOSES:  Enlarging mass, right palm between index and long, metacarpal necks adjacent to neurovascular structures including common digital artery to index and long finger and common digital nerve to index and long finger.  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  OPERATION:  Excisional biopsy of subfascial tumor, right palm.  OPERATING SURGEON:  Katy Fitch. Laurine Kuyper, MD  ASSISTANT:  Annye Rusk, PA-C  ANESTHESIA:  General by LMA.  SUPERVISING ANESTHESIOLOGIST:  Zenon Mayo, MD  INDICATIONS:  Meghan Frank is a 46 year old woman who was self referred for evaluation and management of a mass in her right palm.  She was seen several months prior and was noted to have a jelly bean sized mass.  For various reasons, she had postponed surgery for approximately 2 months. She now has a mass that measured about 1.5 cm x 1 cm, which is perhaps doubled in size since our first encounter.  We strenuously advised proceeding with biopsy for diagnosis and hopeful resolution of this predicament.  After informed consent, she was brought to the operating room at this time.  PROCEDURE:  Meghan Frank was interviewed in the holding area and advised as to the anticipated procedure.  A proper surgical site identification protocol was followed with marking of her hand at the site of the mass.  After informed consent, she was transferred to room #1 of the [facility] and placed in supine position on the operating table.  Following induction of general anesthesia by LMA technique, the right arm was prepped with Betadine soap and solution, sterilely  draped.  Following exsanguination of right arm with Esmarch bandage, the proximal brachium was inflated to 220 mmHg.  Following routine surgical time-out, procedure commenced with a oblique incision directly over the mass. Subcutaneous tissues were carefully divided, taking care to identify the common digital nerve to the index and long finger as well as the branch to the index finger.  The mass was on the ulnar aspect of the common digital artery.  This measured 1.5 cm x 1 cm.  It was opalescent, firm, and moderately vascular.  This had the appearance of a nerve element tumor.  This was circumferentially dissected.  It did have pseudo encapsulation. It had a vascular stalk and a small neural branch leading to it.  Margins of approximately 5 mm on this vascular and neural stalks were obtained and bipolar cautery was used to transect the nerve and artery.  The mass was passed off in formalin for pathologic evaluation.  The wound was repaired with intradermal 3-0 Prolene suture and Steri-Strips.  Lidocaine 2% was infiltrated for postop anesthesia followed by dressing of the hand with sterile gauze and Ace wrap.  There were no apparent complications.     Katy Fitch Krysia Zahradnik, M.D.     RVS/MEDQ  D:  11/25/2011  T:  11/25/2011  Job:  [id number]

## 2011-11-25 NOTE — Discharge Instructions (Signed)

## 2011-11-29 ENCOUNTER — Encounter (HOSPITAL_BASED_OUTPATIENT_CLINIC_OR_DEPARTMENT_OTHER): Payer: Self-pay | Admitting: Orthopedic Surgery

## 2011-12-01 ENCOUNTER — Encounter (HOSPITAL_BASED_OUTPATIENT_CLINIC_OR_DEPARTMENT_OTHER): Payer: Self-pay

## 2011-12-01 ENCOUNTER — Encounter: Payer: Self-pay | Admitting: Internal Medicine

## 2011-12-01 DIAGNOSIS — D3612 Benign neoplasm of peripheral nerves and autonomic nervous system, upper limb, including shoulder: Secondary | ICD-10-CM | POA: Insufficient documentation

## 2012-01-12 ENCOUNTER — Other Ambulatory Visit: Payer: Self-pay | Admitting: Family Medicine

## 2012-01-12 ENCOUNTER — Telehealth: Payer: Self-pay | Admitting: Internal Medicine

## 2012-01-12 DIAGNOSIS — K625 Hemorrhage of anus and rectum: Secondary | ICD-10-CM

## 2012-01-12 NOTE — Telephone Encounter (Signed)
Patient will come see Willette Cluster RNP tomorrow 01/13/12 at 9:30.  I have advised Terri at Dr. Rosezella [place] office that we do not have an available provider this pm.  She is advised that if the patient's bleeding is increased she should be advised to go to the ER for eval.

## 2012-01-12 NOTE — Telephone Encounter (Signed)
Pt is requesting GI referral.  May I send?

## 2012-01-12 NOTE — Telephone Encounter (Signed)
Ok to do referral  To gi Urgent because of context.

## 2012-01-12 NOTE — Telephone Encounter (Signed)
Pt notified of referral by telephone.  Order placed in the sytem marked URGENT.

## 2012-01-12 NOTE — Telephone Encounter (Signed)
Caller: Daven/Patient; PCP: Madelin Headings.; CB#: [phone] or [phone]; Call regarding Rectal Bleeding; symptoms started years ago per patient; but for the last week since 01/05/12 the bleeding has increased; bleeding today like a mentrual period; pt has a history of hemorrhoids;  no bleeding unless has a BM; has approximately 0-1 BM daily; Triaged per Gastrointestinal Bleeding Guideline; See in 4 hr due to one or more episodes of rectal bleeding and no symptoms of hypovolemia; Dr Fabian Sharp has no appointments for today;  pt is refusing appt in the office; would like only be seen by GI; she would like to know which Gastro MD she needs to see; no referral is needed by insurance; refusing to be seen in the office by any other MD; aware of the need to be seen in 4 hr; states that she can not be seen today due to transportation issues;  please call patient back regarding which gastro doctor Dr Fabian Sharp recommends; please call back at above numbers today; OFFICE PLEASE REVIEW DUE TO PT NEEDS SEEN IN 4 HOURS BUT REFUSING APPOINTMENT

## 2012-01-13 ENCOUNTER — Encounter: Payer: Self-pay | Admitting: Nurse Practitioner

## 2012-01-13 ENCOUNTER — Ambulatory Visit (INDEPENDENT_AMBULATORY_CARE_PROVIDER_SITE_OTHER): Payer: BC Managed Care – PPO | Admitting: Nurse Practitioner

## 2012-01-13 VITALS — BP 110/70 | HR 80 | Ht 64.0 in | Wt 142.0 lb

## 2012-01-13 DIAGNOSIS — K625 Hemorrhage of anus and rectum: Secondary | ICD-10-CM

## 2012-01-13 DIAGNOSIS — K649 Unspecified hemorrhoids: Secondary | ICD-10-CM

## 2012-01-13 DIAGNOSIS — Z8371 Family history of colonic polyps: Secondary | ICD-10-CM

## 2012-01-13 DIAGNOSIS — Z83719 Family history of colon polyps, unspecified: Secondary | ICD-10-CM

## 2012-01-13 MED ORDER — HYDROCORTISONE ACETATE 25 MG RE SUPP: 25.0000 mg | Freq: Two times a day (BID) | RECTAL | Status: AC

## 2012-01-13 MED ORDER — NA SULFATE-K SULFATE-MG SULF 17.5-3.13-1.6 GM/177ML PO SOLN: 1.0000 | Freq: Once | ORAL | Status: DC

## 2012-01-13 NOTE — Patient Instructions (Addendum)
We sent a prescription for Anusol HC Suppositories, use twice daily for 10 days. We have given you a sample of the colonosocpy prep- Suprep. Scheduled you at [facility] for 01-30-2012 at 8:30 AM . Directions provided. Colonoscopy A colonoscopy is an exam to evaluate your entire colon. In this exam, your colon is cleansed. A long fiberoptic tube is inserted through your rectum and into your colon. The fiberoptic scope (endoscope) is a long bundle of enclosed and very flexible fibers. These fibers transmit light to the area examined and send images from that area to your caregiver. Discomfort is usually minimal. You may be given a drug to help you sleep (sedative) during or prior to the procedure. This exam helps to detect lumps (tumors), polyps, inflammation, and areas of bleeding. Your caregiver may also take a small piece of tissue (biopsy) that will be examined under a microscope. LET YOUR CAREGIVER KNOW ABOUT:   Allergies to food or medicine.   Medicines taken, including vitamins, herbs, eyedrops, over-the-counter medicines, and creams.   Use of steroids (by mouth or creams).   Previous problems with anesthetics or numbing medicines.   History of bleeding problems or blood clots.   Previous surgery.   Other health problems, including diabetes and kidney problems.   Possibility of pregnancy, if this applies.  BEFORE THE PROCEDURE   A clear liquid diet may be required for 2 days before the exam.   Ask your caregiver about changing or stopping your regular medications.   Liquid injections (enemas) or laxatives may be required.   A large amount of electrolyte solution may be given to you to drink over a short period of time. This solution is used to clean out your colon.   You should be present 60 minutes prior to your procedure or as directed by your caregiver.  AFTER THE PROCEDURE   If you received a sedative or pain relieving medication, you will need to  arrange for someone to drive you home.   Occasionally, there is a little blood passed with the first bowel movement. Do not be concerned.  FINDING OUT THE RESULTS OF YOUR TEST Not all test results are available during your visit. If your test results are not back during the visit, make an appointment with your caregiver to find out the results. Do not assume everything is normal if you have not heard from your caregiver or the medical facility. It is important for you to follow up on all of your test results. HOME CARE INSTRUCTIONS   It is not unusual to pass moderate amounts of gas and experience mild abdominal cramping following the procedure. This is due to air being used to inflate your colon during the exam. Walking or a warm pack on your belly (abdomen) may help.   You may resume all normal meals and activities after sedatives and medicines have worn off.   Only take over-the-counter or prescription medicines for pain, discomfort, or fever as directed by your caregiver. Do not use aspirin or blood thinners if a biopsy was taken. Consult your caregiver for medicine usage if biopsies were taken.  SEEK IMMEDIATE MEDICAL CARE IF:   You have a fever.   You pass large blood clots or fill a toilet with blood following the procedure. This may also occur 10 to 14 days following the procedure. This is more likely if a biopsy was taken.   You develop abdominal pain that keeps getting worse and cannot be relieved with  medicine.  Document Released: 05/20/2000 Document Revised: 05/12/2011 Document Reviewed: 01/03/2008 [facility] Patient Information 2012 [place], [place].

## 2012-01-13 NOTE — Progress Notes (Signed)
01/13/2012 Meghan Frank [id number] 03/16/1966   HISTORY OF PRESENT ILLNESS:  Patient is a 46-year-old female referred by her PCP for evaluation of rectal bleeding. Patient has a several year history of hemorrhoidal problems consisting of frequent, low volume rectal bleeding. She has associated anal discomfort. Her hemoglobin averages 11 to mid 12 range.  She had what sounds like a hemorrhoidectomy a few years ago and her problems have actually gotten much worse since. She has anal discomfort from the hemorrhoids. Over the last several days the amount of blood has significantly increased. As far as her bowel movements, patient has diarrhea a couple of times a month for which she takes Imodium and then becomes somewhat constipated. No abdominal pain. Her grandfather had colon cancer. Father had colon polyps in his sixties.  Past Medical History  Diagnosis Date  . Recurrent herpes labialis   . Recurrent nephrolithiasis   . Complication of anesthesia     "doesn't wake up well"  . PONV (postoperative nausea and vomiting)   . Anemia   . Gallstone    Past Surgical History  Procedure Date  . Cholecystectomy 2008  . Tubal ligation 2000  . Laser ablation 2010    uterine ablation  . Lithotripsy 2008  . Mandible surgery 1985  . Cystoscopy/retrograde/ureteroscopy/stone extraction with basket 04/29/2011    Procedure: CYSTOSCOPY/RETROGRADE/URETEROSCOPY/STONE EXTRACTION WITH BASKET;  Surgeon: Sigmund I Tannenbaum, MD;  Location: [facility];  Service: Urology;  Laterality: Bilateral;  bilateral retrogrades, bilateral ureteroscopy, left stone basketry  . Mass excision 11/25/2011    right hand-neuroma removed  . Hemorrhoid surgery 2007    reports that she quit smoking about 8 months ago. Her smoking use included Cigarettes. She has never used smokeless tobacco. She reports that she does not drink alcohol or use illicit drugs. family history includes Colon cancer in her maternal grandfather; Colon polyps in  her father; Coronary artery disease in her father, maternal grandmother, and paternal grandmother; Diabetes in her father; Diverticulosis in her father; Hyperlipidemia in her father; and Lung cancer in her paternal grandfather. Allergies  Allergen Reactions  . Bee Venom Other (See Comments)    Patient receives antibiotic cream for infection due to sting  . Sulfonamide Derivatives Rash      Outpatient Encounter Prescriptions as of 01/13/2012  Medication Sig Dispense Refill  . Potassium Citrate 15 MEQ (1620 MG) TBCR Take 1 tablet by mouth 2 (two) times daily.      . valACYclovir (VALTREX) 1000 MG tablet Take 1,000-2,000 mg by mouth as directed. For outbreak, take 1 tablet twice daily for 3 days, then 1 tablet once daily until fever blister is gone       . DISCONTD: FLUoxetine (PROZAC) 10 MG capsule Take 10 mg by mouth daily.          REVIEW OF SYSTEMS  : All other systems reviewed and negative except where noted in the History of Present Illness.   PHYSICAL EXAM: BP 110/70  Pulse 80  Ht 5' 4" (1.626 m)  Wt 142 lb (64.411 kg)  BMI 24.37 kg/m2 General: Well developed white female in no acute distress Head: Normocephalic and atraumatic Eyes:  sclerae anicteric,conjunctive pink. Ears: Normal auditory acuity Mouth: No deformity or lesions Neck: Supple, no masses.  Lungs: Clear throughout to auscultation Heart: Regular rate and rhythm; no murmurs heard Abdomen: Soft, non distended, nontender. No masses or hepatomegaly noted. Normal Bowel sounds Rectal: Small external hemorrhoids and Internal hemorrhoids on anoscopy.  Musculoskeletal: Symmetrical with no gross   deformities  Skin: No lesions on visible extremities Extremities: No edema or deformities noted Neurological: Alert oriented x 4, grossly nonfocal Cervical Nodes:  No significant cervical adenopathy Psychological:  Alert and cooperative. Normal mood and affect  ASSESSMENT AND PLAN   46-year-old female with chronic rectal  bleeding which has  recently increased in volume. Suspect hemorrhoidal bleeding but given family history of colon polyps in father and colon cancer in grandfather, patient will be scheduled for a colonoscopy for further evaluation..The risks, benefits, and alternatives to colonoscopy with possible biopsy and possible polypectomy were discussed with the patient and they consent to proceed. Procedure will be done at [facility] so internal hemorrhoids can be banded at time of procedure. In meantime, will treat with Anusol HC suppositories. .     

## 2012-01-18 NOTE — Progress Notes (Signed)
Agree with Ms. Guenther's assessment and plan. Jaeanna Mccomber E. Mayumi Summerson, MD, FACG   

## 2012-01-30 ENCOUNTER — Encounter (HOSPITAL_COMMUNITY): Payer: Self-pay | Admitting: *Deleted

## 2012-01-30 ENCOUNTER — Ambulatory Visit (HOSPITAL_COMMUNITY)
Admission: RE | Admit: 2012-01-30 | Discharge: 2012-01-30 | Disposition: A | Discharge status: Home / Self Care | Payer: BC Managed Care – PPO | Source: Ambulatory Visit | Attending: Internal Medicine | Admitting: Internal Medicine

## 2012-01-30 ENCOUNTER — Encounter (HOSPITAL_COMMUNITY)
Admission: RE | Disposition: A | Discharge status: Home / Self Care | Payer: Self-pay | Source: Ambulatory Visit | Attending: Internal Medicine

## 2012-01-30 DIAGNOSIS — K648 Other hemorrhoids: Secondary | ICD-10-CM

## 2012-01-30 DIAGNOSIS — K625 Hemorrhage of anus and rectum: Secondary | ICD-10-CM | POA: Insufficient documentation

## 2012-01-30 DIAGNOSIS — Z8371 Family history of colonic polyps: Secondary | ICD-10-CM

## 2012-01-30 DIAGNOSIS — K644 Residual hemorrhoidal skin tags: Secondary | ICD-10-CM | POA: Insufficient documentation

## 2012-01-30 DIAGNOSIS — K649 Unspecified hemorrhoids: Secondary | ICD-10-CM

## 2012-01-30 HISTORY — PX: COLONOSCOPY: SHX5424

## 2012-01-30 HISTORY — DX: Other hemorrhoids: K64.8

## 2012-01-30 SURGERY — COLONOSCOPY
Anesthesia: Moderate Sedation

## 2012-01-30 MED ORDER — FENTANYL CITRATE 0.05 MG/ML IJ SOLN
INTRAMUSCULAR | Status: DC | PRN
  Administered 2012-01-30 (×3): 25 ug via INTRAVENOUS

## 2012-01-30 MED ORDER — DIPHENHYDRAMINE HCL 50 MG/ML IJ SOLN
INTRAMUSCULAR | Status: AC
  Filled 2012-01-30: qty 1

## 2012-01-30 MED ORDER — SODIUM CHLORIDE 0.9 % IV SOLN
INTRAVENOUS | Status: DC
  Administered 2012-01-30: 500 mL via INTRAVENOUS

## 2012-01-30 MED ORDER — ONDANSETRON HCL 4 MG/2ML IJ SOLN
INTRAMUSCULAR | Status: DC | PRN
  Administered 2012-01-30: 4 mg via INTRAVENOUS

## 2012-01-30 MED ORDER — MIDAZOLAM HCL 10 MG/2ML IJ SOLN
INTRAMUSCULAR | Status: AC
  Filled 2012-01-30: qty 4

## 2012-01-30 MED ORDER — FENTANYL CITRATE 0.05 MG/ML IJ SOLN
INTRAMUSCULAR | Status: AC
  Filled 2012-01-30: qty 4

## 2012-01-30 MED ORDER — ONDANSETRON HCL 4 MG/2ML IJ SOLN
INTRAMUSCULAR | Status: AC
  Filled 2012-01-30: qty 2

## 2012-01-30 MED ORDER — MIDAZOLAM HCL 10 MG/2ML IJ SOLN
INTRAMUSCULAR | Status: DC | PRN
  Administered 2012-01-30: 1 mg via INTRAVENOUS
  Administered 2012-01-30 (×2): 2 mg via INTRAVENOUS

## 2012-01-30 NOTE — Op Note (Signed)
[facility] [address] [place], [address]   COLONOSCOPY PROCEDURE REPORT  PATIENT: Meghan Frank, Meghan Frank  MR#: [id number] BIRTHDATE: 1966-04-23 , 46  yrs. old GENDER: Female ENDOSCOPIST: Iva Boop, MD, [facility] REFERRED WN:UUVOZ Lonie Peak, M.D. PROCEDURE DATE:  01/30/2012 PROCEDURE:   Colonoscopy, diagnostic ASA CLASS:   Class I INDICATIONS:rectal bleeding. MEDICATIONS: Fentanyl 75 mg IV and Versed 5 mg IV  DESCRIPTION OF PROCEDURE:   After the risks benefits and alternatives of the procedure were thoroughly explained, informed consent was obtained.  A digital rectal exam revealed external hemorrhoids.   The Pentax Colonoscope [id number]  endoscope was introduced through the anus and advanced to the cecum, which was identified by both the appendix and ileocecal valve. No adverse events experienced.   The quality of the prep was excellent, using MoviPrep  The instrument was then slowly withdrawn as the colon was fully examined.      COLON FINDINGS: A normal appearing cecum, ileocecal valve, and appendiceal orifice were identified.  The ascending, hepatic flexure, transverse, splenic flexure, descending, sigmoid colon and rectum appeared unremarkable.  No polyps or cancers were seen. Retroflexed views revealed internal hemorrhoids only - smallThe time to cecum=4 minutes 0 seconds  Withdrawal time=8 minutes 0 seconds.  The scope was withdrawn and the procedure completed. COMPLICATIONS: There were no complications. ENDOSCOPIC IMPRESSION: 1.   Normal colon 2.   Small internal and large external hemorrhoids 3.    The external hemorrhoids are predominant.  internal are small and I did not ligate them due to external and proximity of internal to dentate line.  RECOMMENDATIONS: 1.  Continue topical therapy for hemoorhoids as needed for now. Needs surgical evaluation if she desires, she is to contact me if so. 2.  repeat Colonscopy in 10 years -  routine  eSigned:  Iva Boop, MD, [facility] 01/30/2012 9:14 AM   cc: Madelin Headings, MD

## 2012-01-30 NOTE — H&P (View-Only) (Signed)
01/13/2012 Meghan Frank [id number] [birth year]   HISTORY OF PRESENT ILLNESS:  Patient is a 46 year old female referred by her PCP for evaluation of rectal bleeding. Patient has a several year history of hemorrhoidal problems consisting of frequent, low volume rectal bleeding. She has associated anal discomfort. Her hemoglobin averages 11 to mid 12 range.  She had what sounds like a hemorrhoidectomy a few years ago and her problems have actually gotten much worse since. She has anal discomfort from the hemorrhoids. Over the last several days the amount of blood has significantly increased. As far as her bowel movements, patient has diarrhea a couple of times a month for which she takes Imodium and then becomes somewhat constipated. No abdominal pain. Her grandfather had colon cancer. Father had colon polyps in his sixties.  Past Medical History  Diagnosis Date  . Recurrent herpes labialis   . Recurrent nephrolithiasis   . Complication of anesthesia     "doesn't wake up well"  . PONV (postoperative nausea and vomiting)   . Anemia   . Gallstone    Past Surgical History  Procedure Date  . Cholecystectomy 2008  . Tubal ligation 2000  . Laser ablation 2010    uterine ablation  . Lithotripsy 2008  . Mandible surgery 1985  . Cystoscopy/retrograde/ureteroscopy/stone extraction with basket 04/29/2011    Procedure: CYSTOSCOPY/RETROGRADE/URETEROSCOPY/STONE EXTRACTION WITH BASKET;  Surgeon: Kathi Ludwig, MD;  Location: [facility];  Service: Urology;  Laterality: Bilateral;  bilateral retrogrades, bilateral ureteroscopy, left stone basketry  . Mass excision 11/25/2011    right hand-neuroma removed  . Hemorrhoid surgery 2007    reports that she quit smoking about 8 months ago. Her smoking use included Cigarettes. She has never used smokeless tobacco. She reports that she does not drink alcohol or use illicit drugs. family history includes Colon cancer in her maternal grandfather; Colon polyps in  her father; Coronary artery disease in her father, maternal grandmother, and paternal grandmother; Diabetes in her father; Diverticulosis in her father; Hyperlipidemia in her father; and Lung cancer in her paternal grandfather. Allergies  Allergen Reactions  . Bee Venom Other (See Comments)    Patient receives antibiotic cream for infection due to sting  . Sulfonamide Derivatives Rash      Outpatient Encounter Prescriptions as of 01/13/2012  Medication Sig Dispense Refill  . Potassium Citrate 15 MEQ (1620 MG) TBCR Take 1 tablet by mouth 2 (two) times daily.      . valACYclovir (VALTREX) 1000 MG tablet Take 1,000-2,000 mg by mouth as directed. For outbreak, take 1 tablet twice daily for 3 days, then 1 tablet once daily until fever blister is gone       . DISCONTD: FLUoxetine (PROZAC) 10 MG capsule Take 10 mg by mouth daily.          REVIEW OF SYSTEMS  : All other systems reviewed and negative except where noted in the History of Present Illness.   PHYSICAL EXAM: BP 110/70  Pulse 80  Ht 5\' 4"  (1.626 m)  Wt 142 lb (64.411 kg)  BMI 24.37 kg/m2 General: Well developed white female in no acute distress Head: Normocephalic and atraumatic Eyes:  sclerae anicteric,conjunctive pink. Ears: Normal auditory acuity Mouth: No deformity or lesions Neck: Supple, no masses.  Lungs: Clear throughout to auscultation Heart: Regular rate and rhythm; no murmurs heard Abdomen: Soft, non distended, nontender. No masses or hepatomegaly noted. Normal Bowel sounds Rectal: Small external hemorrhoids and Internal hemorrhoids on anoscopy.  Musculoskeletal: Symmetrical with no gross  deformities  Skin: No lesions on visible extremities Extremities: No edema or deformities noted Neurological: Alert oriented x 4, grossly nonfocal Cervical Nodes:  No significant cervical adenopathy Psychological:  Alert and cooperative. Normal mood and affect  ASSESSMENT AND PLAN   46 year old female with chronic rectal  bleeding which has  recently increased in volume. Suspect hemorrhoidal bleeding but given family history of colon polyps in father and colon cancer in grandfather, patient will be scheduled for a colonoscopy for further evaluation..The risks, benefits, and alternatives to colonoscopy with possible biopsy and possible polypectomy were discussed with the patient and they consent to proceed. Procedure will be done at [facility] so internal hemorrhoids can be banded at time of procedure. In meantime, will treat with Anusol HC suppositories. Marland Kitchen

## 2012-01-30 NOTE — Interval H&P Note (Signed)
History and Physical Interval Note:  01/30/2012 8:34 AM  Meghan Frank  has presented today for surgery, with the diagnosis of Hemorrhage of rectum and anus [569.3] Unspecified hemorrhoids without mention of complication [455.6] Family history of colonic polyps [V18.51]  The various methods of treatment have been discussed with the patient and family. After consideration of risks, benefits and other options for treatment, the patient has consented to  Procedure(s) (LRB): COLONOSCOPY (N/A) HEMORRHOID BANDING (N/A) as a surgical intervention .  The patient's history has been reviewed, patient examined, no change in status, stable for surgery.  I have reviewed the patient's chart and labs.  Questions were answered to the patient's satisfaction.     Stan Head, MD, [facility]

## 2012-01-31 ENCOUNTER — Encounter (HOSPITAL_COMMUNITY): Payer: Self-pay | Admitting: Internal Medicine

## 2012-02-07 ENCOUNTER — Telehealth: Payer: Self-pay | Admitting: Internal Medicine

## 2012-02-07 DIAGNOSIS — K625 Hemorrhage of anus and rectum: Secondary | ICD-10-CM

## 2012-02-07 DIAGNOSIS — K649 Unspecified hemorrhoids: Secondary | ICD-10-CM

## 2012-02-07 NOTE — Telephone Encounter (Signed)
Per colonoscopy report patient needs a surgical referral if her hemorrhoids are still bothering her and Dr. Leone Payor has recommended the new colorectal surgeon coming to [facility].  At this time Dr. Romie Levee is not accepting [employer] and the patient would like to wait until she is accepting [employer].  She will call me back in late September or Mid October.

## 2012-05-01 ENCOUNTER — Encounter (INDEPENDENT_AMBULATORY_CARE_PROVIDER_SITE_OTHER): Payer: Self-pay | Admitting: General Surgery

## 2012-05-01 ENCOUNTER — Ambulatory Visit (INDEPENDENT_AMBULATORY_CARE_PROVIDER_SITE_OTHER): Payer: BC Managed Care – PPO | Admitting: General Surgery

## 2012-05-01 VITALS — BP 110/72 | HR 78 | Temp 97.1°F | Resp 18 | Ht 64.0 in | Wt 144.0 lb

## 2012-05-01 DIAGNOSIS — L290 Pruritus ani: Secondary | ICD-10-CM

## 2012-05-01 NOTE — Progress Notes (Signed)
Chief Complaint  Patient presents with  . Hemorrhoids  . Rectal Problems    HISTORY: Meghan Frank is a 46 y.o. female who presents to the office with rectal pain.  Other symptoms include bleeding and itching/burning.  She has tried steroid suppositories in the past with some success.  It is unclear what makes the symptoms worse.  This had been occurring for the last year.  It is intermittent in nature.  Her bowel habits are regular and her bowel movements are soft.  Her fiber intake is moderate.  Her last colonoscopy was in Aug and normal except for the hemorrhoids.  She denies any prolapsing tissue.      Past Medical History  Diagnosis Date  . Recurrent herpes labialis   . Recurrent nephrolithiasis   . Complication of anesthesia     "doesn't wake up well"  . Anemia   . Gallstone   . PONV (postoperative nausea and vomiting)   . Internal/external hemorrhoids with bleeding 01/30/2012      Past Surgical History  Procedure Date  . Cholecystectomy 2008  . Tubal ligation 2000  . Laser ablation 2010    uterine ablation  . Lithotripsy 2008  . Mandible surgery 1985  . Cystoscopy/retrograde/ureteroscopy/stone extraction with basket 04/29/2011    Procedure: CYSTOSCOPY/RETROGRADE/URETEROSCOPY/STONE EXTRACTION WITH BASKET;  Surgeon: Kathi Ludwig, MD;  Location: [facility];  Service: Urology;  Laterality: Bilateral;  bilateral retrogrades, bilateral ureteroscopy, left stone basketry  . Mass excision 11/25/2011    right hand-neuroma removed  . Hemorrhoid surgery 2007  . Colonoscopy 01/30/2012    Procedure: COLONOSCOPY;  Surgeon: Iva Boop, MD;  Location: [facility];  Service: Endoscopy;  Laterality: N/A;  . Hand surgery 2013    Neuroma        Current Outpatient Prescriptions  Medication Sig Dispense Refill  . hydrocortisone (ANUSOL-HC) 25 MG suppository Place 1 suppository (25 mg total) rectally 2 (two) times daily.  20 suppository  1  . Potassium Citrate 15 MEQ (1620 MG) TBCR  Take 1 tablet by mouth 2 (two) times daily.      . valACYclovir (VALTREX) 1000 MG tablet Take 1,000-2,000 mg by mouth as directed. For outbreak, take 1 tablet twice daily for 3 days, then 1 tablet once daily until fever blister is gone           Allergies  Allergen Reactions  . Bee Venom Other (See Comments)    Patient receives antibiotic cream for infection due to sting  . Sulfonamide Derivatives Rash      Family History  Problem Relation Age of Onset  . Coronary artery disease Father   . Diabetes Father   . Hyperlipidemia Father   . Coronary artery disease Paternal Grandmother   . Coronary artery disease Maternal Grandmother   . Colon cancer Maternal Grandfather   . Colon polyps Father   . Diverticulosis Father   . Lung cancer Paternal Grandfather     History   Social History  . Marital Status: Divorced    Spouse Name: N/A    Number of Children: N/A  . Years of Education: N/A   Social History Main Topics  . Smoking status: Former Smoker    Types: Cigarettes    Quit date: 04/26/2011  . Smokeless tobacco: Never Used  . Alcohol Use: Yes     Comment: occasional- couple times a year  . Drug Use: No  . Sexually Active: Yes    Birth Control/ Protection: Surgical   Other Topics  Concern  . None   Social History Narrative   Former smoker[employer] of [employer] [place] in [place]      REVIEW OF SYSTEMS - PERTINENT POSITIVES ONLY: Review of Systems - General ROS: negative for - chills or fever Hematological and Lymphatic ROS: negative for - bleeding problems or blood clots Respiratory ROS: no cough, shortness of breath, or wheezing Cardiovascular ROS: no chest pain or dyspnea on exertion Gastrointestinal ROS: no abdominal pain, change in bowel habits, or black stools  EXAM: Filed Vitals:   05/01/12 1027  BP: 110/72  Pulse: 78  Temp: 97.1 F (36.2 C)  Resp: 18    General appearance: alert and cooperative Resp:  clear to auscultation bilaterally Cardio: regular rate and rhythm GI: soft, non-tender; bowel sounds normal; no masses,  no organomegaly   Procedure: Anoscopy Surgeon: Maisie Fus Diagnosis: rectal itching, pain  Assistant: Christella Scheuermann After the risks and benefits were explained, verbal consent was obtained for above procedure  Anesthesia: none Findings: circumferential anal skin tags, mild irritation on right side    ASSESSMENT AND PLAN: I suspect her pain is being caused by intermittent fissures.  I do not currently see one though.  I believe her itching is being caused by over wiping the area and irregular bowel movements.  I have instructed her to increase her fiber intake to help with her stool consistency.  I will see her back in 6 weeks or so to see how things are going.      Vanita Panda, MD Colon and Rectal Surgery / General Surgery [facility]      Visit Diagnoses: 1. Pruritus ani     Primary Care Physician: Lorretta Harp, MD

## 2012-05-01 NOTE — Patient Instructions (Signed)
Fiber Chart  You should 25-30g of fiber per day and drinking 8 glasses of water to help your bowels move regularly.  In the chart below you can look up how much fiber you are getting in an average day.  If you are not getting enough fiber, you should add a fiber supplement to your diet.  Examples of this include Metamucil, FiberCon and Citrucel.  These can be purchased at your local grocery store or pharmacy.      [email or web address]   Pruritus Ani  What is Pruritus Ani (proo-r-tus a-n)? Itching around the anal area is called pruritus ani. This condition results in a compelling urge to scratch. What causes this to happen? Several factors may be at fault. A common cause is excessive moisture in the anal area. Moisture may be due to perspiration or a small amount of residual stool around the anal area. Pruritis ani may be a symptom of other common anal conditions such as hemorrhoids and anal fissures. The initial condition can be made worse by scratching, vigorous cleansing of the area or overuse of topical treatments. In some individuals pruritus ani may be caused by eating certain foods, smoking and drinking alcoholic beverages, especially beer and wine. Food items that have been associated with pruritus ani include: . Coffee, Tea . Carbonated beverages . Milk products . Tomatoes and tomato products such as Ketchup . Cheese . Chocolate . Nuts Does Pruritus Ani result from lack of cleanliness? Cleanliness is almost never a factor. However, the natural tendency once a person develops this itching is to wash the area vigorously and frequently with soap and a washcloth. This almost always makes the problem worse by damaging the skin and washing away protective natural oils. What can be done to make this itching go away? A careful examination by a colon and rectal surgeon or other physician may identify a definite cause for the itching.  Your physician can recommend treatment to eliminate the specific problem. Treatment of pruritus ani may include these three points. 1. AVOID MOISTURE in the anal area: . Apply either a few wisps of cotton, a 4 x 4 gauze or some cornstarch powder to keep the area dry. . Avoid all medicated, perfumed and deodorant powders. 2. AVOID FURTHER TRAUMA to the affected area: Marland Kitchen Do not use soap of any kind on the anal area. . Do not scrub the anal area with anything - even toilet paper. . For hygiene, it is best to rinse with warm water and pat the area dry. Use wet toilet paper, baby wipes or a wet washcloth to blot the area clean. Never rub. . Try not to scratch the itchy area. Scratching produces more damage, which in turn makes the itching worse. For individuals that experience irresistible itching at night, wearing socks on the hands may be helpful. 3. USE ONLY MEDICATIONS AS DIRECTED BY YOUR PHYSICIAN. Apply prescription medications sparingly to the skin around the anal area and avoid rubbing. Prolonged use of prescribed or over the counter topical medications may result in irritation or skin dryness that can make the condition worse. How long does this treatment usually take? Most people experience some relief from itching within a week. If symptoms do not resolve after 6 weeks, a follow-up appointment with your colon and rectal surgeon may be needed.               2012 [employer] of [employer]

## 2012-06-12 ENCOUNTER — Ambulatory Visit (INDEPENDENT_AMBULATORY_CARE_PROVIDER_SITE_OTHER): Payer: BC Managed Care – PPO | Admitting: General Surgery

## 2012-08-14 ENCOUNTER — Other Ambulatory Visit: Payer: Self-pay | Admitting: Obstetrics & Gynecology

## 2012-08-14 DIAGNOSIS — Z1231 Encounter for screening mammogram for malignant neoplasm of breast: Secondary | ICD-10-CM

## 2012-09-14 ENCOUNTER — Ambulatory Visit
Admission: RE | Admit: 2012-09-14 | Discharge: 2012-09-14 | Disposition: A | Discharge status: Home / Self Care | Payer: BC Managed Care – PPO | Source: Ambulatory Visit | Attending: Obstetrics & Gynecology | Admitting: Obstetrics & Gynecology

## 2012-09-14 DIAGNOSIS — Z1231 Encounter for screening mammogram for malignant neoplasm of breast: Secondary | ICD-10-CM

## 2013-01-23 ENCOUNTER — Telehealth: Payer: Self-pay | Admitting: Certified Nurse Midwife

## 2013-01-23 NOTE — Telephone Encounter (Signed)
Patient is calling to give you some information and asked that you call her back.

## 2013-01-24 NOTE — Telephone Encounter (Signed)
Meghan Frank this note went to [facility] but she is our patient, would you call her about information she is updating [place] on.  Her last AEX 08/2012.

## 2013-01-25 ENCOUNTER — Telehealth: Payer: Self-pay | Admitting: *Deleted

## 2013-01-25 DIAGNOSIS — E28310 Symptomatic premature menopause: Secondary | ICD-10-CM

## 2013-01-25 NOTE — Telephone Encounter (Signed)
I had a message to call pt-to update something in chart. Pt was very rude and refused to talk to anyone but Shirlyn Goltz. "I've been calling for two days and want only Patty to call me back."-pt. Chart on Patty's desk.

## 2013-01-25 NOTE — Telephone Encounter (Signed)
Patient is having a lot of problems with vaso symptoms and night sweats.  Sleep is interrupted. She is having "Brain fog" and hair is thinning and falling out.  Her symptoms are worse than last AEX.  Request a repeat of labs that were done in 2010 by Dr. Hyacinth Meeker. Discussed that she could come in fasting and repeat and go from there.

## 2013-01-28 ENCOUNTER — Other Ambulatory Visit (INDEPENDENT_AMBULATORY_CARE_PROVIDER_SITE_OTHER): Payer: BC Managed Care – PPO

## 2013-01-28 DIAGNOSIS — E28319 Asymptomatic premature menopause: Secondary | ICD-10-CM

## 2013-01-28 DIAGNOSIS — E28310 Symptomatic premature menopause: Secondary | ICD-10-CM

## 2013-01-28 LAB — THYROID PANEL WITH TSH
Free Thyroxine Index: 3.3 (ref 1.0–3.9)
T3 Uptake: 37 % (ref 22.5–37.0)
TSH: 2.348 u[IU]/mL (ref 0.350–4.500)

## 2013-01-28 LAB — LIPID PANEL
Cholesterol: 146 mg/dL (ref 0–200)
LDL Cholesterol: 89 mg/dL (ref 0–99)
VLDL: 8 mg/dL (ref 0–40)

## 2013-01-30 NOTE — Telephone Encounter (Signed)
Spoke with pt about lab results not visible via Mychart. Advised pt thyroid studies and FSH in normal range. Pt c/o thinning hair, brain fog, and feeling "hormonal" in general. Has spoken with PG about symptoms, and was just curious if lab results would show a possible cause. Pt states she may look into homeopathic remedies to help. Pt knows she can email PG via Mychart anytime. Pt appreciative.

## 2013-02-01 ENCOUNTER — Telehealth: Payer: Self-pay | Admitting: Nurse Practitioner

## 2013-02-01 NOTE — Telephone Encounter (Signed)
Called and left message to return call about lab results.

## 2013-02-06 NOTE — Telephone Encounter (Signed)
Patient is calling Meghan Frank back.

## 2013-02-21 ENCOUNTER — Encounter: Payer: Self-pay | Admitting: Internal Medicine

## 2013-02-21 ENCOUNTER — Ambulatory Visit (INDEPENDENT_AMBULATORY_CARE_PROVIDER_SITE_OTHER): Payer: BC Managed Care – PPO | Admitting: Internal Medicine

## 2013-02-21 VITALS — BP 120/80 | HR 86 | Temp 98.5°F | Wt 146.0 lb

## 2013-02-21 DIAGNOSIS — T50905S Adverse effect of unspecified drugs, medicaments and biological substances, sequela: Secondary | ICD-10-CM

## 2013-02-21 DIAGNOSIS — J309 Allergic rhinitis, unspecified: Secondary | ICD-10-CM

## 2013-02-21 DIAGNOSIS — J069 Acute upper respiratory infection, unspecified: Secondary | ICD-10-CM

## 2013-02-21 MED ORDER — FLUTICASONE PROPIONATE 50 MCG/ACT NA SUSP: NASAL | Status: DC

## 2013-02-21 NOTE — Progress Notes (Signed)
Chief Complaint  Patient presents with  . Sore Throat    Ongoing for 10 days.  . nasal discharge  . chest congestion  . Cough    HPI: Patient comes in today for SDA for  new problem evaluation. Onset about 10 days ,  conerned about cough and off nad on sore throat  poppoing ears.  And chest. No wheezing fever or chills boss was concerned about her and told her to get checked out Generic claritin and mucinex d . Decongestant did make her not sleep and side effects and she will be stopping that Cough is minimally dry does have postnasal drainage but no face pain fever or.  May have some underlying allergy  ROS: See pertinent positives and negatives per HPI. No chest pain or otherwise shortness of breath unusual rashes exposures still have some hoarseness.  Past Medical History  Diagnosis Date  . Recurrent herpes labialis   . Recurrent nephrolithiasis   . Complication of anesthesia     "doesn't wake up well"  . Anemia   . Gallstone   . PONV (postoperative nausea and vomiting)   . Internal/external hemorrhoids with bleeding 01/30/2012    Family History  Problem Relation Age of Onset  . Coronary artery disease Father   . Diabetes Father   . Hyperlipidemia Father   . Coronary artery disease Paternal Grandmother   . Coronary artery disease Maternal Grandmother   . Colon cancer Maternal Grandfather   . Colon polyps Father   . Diverticulosis Father   . Lung cancer Paternal Grandfather     History   Social History  . Marital Status: Divorced    Spouse Name: N/A    Number of Children: N/A  . Years of Education: N/A   Social History Main Topics  . Smoking status: Former Smoker    Types: Cigarettes    Quit date: 04/26/2011  . Smokeless tobacco: Never Used  . Alcohol Use: Yes     Comment: occasional- couple times a year  . Drug Use: No  . Sexual Activity: Yes    Birth Control/ Protection: Surgical   Other Topics Concern  . None   Social History Narrative   Former  smoker   HH of 2   Pet dog and [employer] and [employer]   [place] lives out town in [place]   Daily caffeine    Outpatient Encounter Prescriptions as of 02/21/2013  Medication Sig Dispense Refill  . Potassium Citrate 15 MEQ (1620 MG) TBCR Take 1 tablet by mouth 2 (two) times daily.      . valACYclovir (VALTREX) 1000 MG tablet Take 1,000-2,000 mg by mouth as directed. For outbreak, take 1 tablet twice daily for 3 days, then 1 tablet once daily until fever blister is gone       . fluticasone (FLONASE) 50 MCG/ACT nasal spray 2 spray each nostril qd  16 g  3   No facility-administered encounter medications on file as of 02/21/2013.    EXAM:  BP 120/80  Pulse 86  Temp(Src) 98.5 F (36.9 C) (Oral)  Wt 146 lb (66.225 kg)  BMI 25.05 kg/m2  SpO2 98%  Body mass index is 25.05 kg/(m^2).  WDWN in NAD  quiet respirations; minimal  congested  somewhat hoarse. Non toxic . HEENT: Normocephalic ;atraumatic , Eyes;  PERRL, EOMs  Full, lids and conjunctiva clear,,Ears: no deformities, canals nl, TM landmarks normal, Nose: no deformity or discharge but congested;face non tender Mouth : OP clear without  lesion or edema . Neck: Supple without adenopathy or masses or bruits Chest:  Clear to A&P without wheezes rales or rhonchi CV:  S1-S2 no gallops or murmurs peripheral perfusion is normal Skin :nl perfusion and no acute rashes  MS: moves all extremities without noticeable focal  abnormality PSYCH: pleasant and cooperative, no obvious depression or anxiety  ASSESSMENT AND PLAN:  Discussed the following assessment and plan:  Viral URI with cough  Allergic rhinitis possible - add on flonase  to asses response  Adverse drug effect, sequela - mucinex d  insomnia   Expectant management. counseld about alarm features  Se of sudafed  Stop as she has done -Patient advised to return or notify health care team  if symptoms worsen or persist or new concerns arise.  Patient Instructions  This acts like  a viral respiratory infection   And still should get better on its own. Stop the decongestant as we discussed .   If allergic  Then nasal drainage   Could be helped    Acute Bronchitis You have acute bronchitis. This means you have a chest cold. The airways in your lungs are red and sore (inflamed). Acute means it is sudden onset.  CAUSES Bronchitis is most often caused by the same virus that causes a cold. SYMPTOMS   Body aches.  Chest congestion.  Chills.  Cough.  Fever.  Shortness of breath.  Sore throat. TREATMENT  Acute bronchitis is usually treated with rest, fluids, and medicines for relief of fever or cough. Most symptoms should go away after a few days or a week. Increased fluids may help thin your secretions and will prevent dehydration. Your caregiver may give you an inhaler to improve your symptoms. The inhaler reduces shortness of breath and helps control cough. You can take over-the-counter pain relievers or cough medicine to decrease coughing, pain, or fever. A cool-air vaporizer may help thin bronchial secretions and make it easier to clear your chest. Antibiotics are usually not needed but can be prescribed if you smoke, are seriously ill, have chronic lung problems, are elderly, or you are at higher risk for developing complications.Allergies and asthma can make bronchitis worse. Repeated episodes of bronchitis may cause longstanding lung problems. Avoid smoking and secondhand smoke.Exposure to cigarette smoke or irritating chemicals will make bronchitis worse. If you are a cigarette smoker, consider using nicotine gum or skin patches to help control withdrawal symptoms. Quitting smoking will help your lungs heal faster. Recovery from bronchitis is often slow, but you should start feeling better after 2 to 3 days. Cough from bronchitis frequently lasts for 3 to 4 weeks. To prevent another bout of acute bronchitis:  Quit smoking.  Wash your hands frequently to get  rid of viruses or use a hand sanitizer.  Avoid other people with cold or virus symptoms.  Try not to touch your hands to your mouth, nose, or eyes. SEEK IMMEDIATE MEDICAL CARE IF:  You develop increased fever, chills, or chest pain.  You have severe shortness of breath or bloody sputum.  You develop dehydration, fainting, repeated vomiting, or a severe headache.  You have no improvement after 1 week of treatment or you get worse. MAKE SURE YOU:   Understand these instructions.  Will watch your condition.  Will get help right away if you are not doing well or get worse. Document Released: 06/30/2004 Document Revised: 08/15/2011 Document Reviewed: 09/15/2010 [facility] Patient Information 2014 [place], [place].      Neta Mends. Panosh M.D.

## 2013-02-21 NOTE — Patient Instructions (Addendum)
This acts like a viral respiratory infection   And still should get better on its own. Stop the decongestant as we discussed .   If allergic  Then nasal drainage   Could be helped    Acute Bronchitis You have acute bronchitis. This means you have a chest cold. The airways in your lungs are red and sore (inflamed). Acute means it is sudden onset.  CAUSES Bronchitis is most often caused by the same virus that causes a cold. SYMPTOMS   Body aches.  Chest congestion.  Chills.  Cough.  Fever.  Shortness of breath.  Sore throat. TREATMENT  Acute bronchitis is usually treated with rest, fluids, and medicines for relief of fever or cough. Most symptoms should go away after a few days or a week. Increased fluids may help thin your secretions and will prevent dehydration. Your caregiver may give you an inhaler to improve your symptoms. The inhaler reduces shortness of breath and helps control cough. You can take over-the-counter pain relievers or cough medicine to decrease coughing, pain, or fever. A cool-air vaporizer may help thin bronchial secretions and make it easier to clear your chest. Antibiotics are usually not needed but can be prescribed if you smoke, are seriously ill, have chronic lung problems, are elderly, or you are at higher risk for developing complications.Allergies and asthma can make bronchitis worse. Repeated episodes of bronchitis may cause longstanding lung problems. Avoid smoking and secondhand smoke.Exposure to cigarette smoke or irritating chemicals will make bronchitis worse. If you are a cigarette smoker, consider using nicotine gum or skin patches to help control withdrawal symptoms. Quitting smoking will help your lungs heal faster. Recovery from bronchitis is often slow, but you should start feeling better after 2 to 3 days. Cough from bronchitis frequently lasts for 3 to 4 weeks. To prevent another bout of acute bronchitis:  Quit smoking.  Wash your hands  frequently to get rid of viruses or use a hand sanitizer.  Avoid other people with cold or virus symptoms.  Try not to touch your hands to your mouth, nose, or eyes. SEEK IMMEDIATE MEDICAL CARE IF:  You develop increased fever, chills, or chest pain.  You have severe shortness of breath or bloody sputum.  You develop dehydration, fainting, repeated vomiting, or a severe headache.  You have no improvement after 1 week of treatment or you get worse. MAKE SURE YOU:   Understand these instructions.  Will watch your condition.  Will get help right away if you are not doing well or get worse. Document Released: 06/30/2004 Document Revised: 08/15/2011 Document Reviewed: 09/15/2010  [facility] Patient Information 2014 [place], [place].

## 2013-04-11 ENCOUNTER — Other Ambulatory Visit: Payer: Self-pay

## 2013-10-09 ENCOUNTER — Other Ambulatory Visit: Payer: Self-pay | Admitting: Nurse Practitioner

## 2013-10-10 ENCOUNTER — Encounter: Payer: Self-pay | Admitting: Nurse Practitioner

## 2013-10-10 ENCOUNTER — Ambulatory Visit (INDEPENDENT_AMBULATORY_CARE_PROVIDER_SITE_OTHER): Payer: BC Managed Care – PPO | Admitting: Nurse Practitioner

## 2013-10-10 VITALS — BP 100/60 | HR 64 | Ht 64.0 in | Wt 147.0 lb

## 2013-10-10 DIAGNOSIS — Z01419 Encounter for gynecological examination (general) (routine) without abnormal findings: Secondary | ICD-10-CM

## 2013-10-10 DIAGNOSIS — Z0000 Encounter for general adult medical examination without abnormal findings: Secondary | ICD-10-CM

## 2013-10-10 LAB — POCT URINALYSIS DIPSTICK
Bilirubin, UA: NEGATIVE
Blood, UA: NEGATIVE
GLUCOSE UA: NEGATIVE
Ketones, UA: NEGATIVE
LEUKOCYTES UA: NEGATIVE
NITRITE UA: NEGATIVE
PROTEIN UA: NEGATIVE
UROBILINOGEN UA: NEGATIVE
pH, UA: 6

## 2013-10-10 LAB — HEMOGLOBIN, FINGERSTICK: HEMOGLOBIN, FINGERSTICK: 13.1 g/dL (ref 12.0–16.0)

## 2013-10-10 MED ORDER — VALACYCLOVIR HCL 1 G PO TABS: ORAL_TABLET | ORAL | Status: DC

## 2013-10-10 NOTE — Patient Instructions (Signed)

## 2013-10-10 NOTE — Progress Notes (Signed)
Patient ID: Meghan Frank, female   DOB: [birth year], 48 y.o.   MRN: [id number] 48 y.o. [id number] Divorced Caucasian Fe here for annual exam.  Same partner for 8 years on / off.  Since ablation no  Spotting.  Patient's last menstrual period was 01/04/2009.          Sexually active: yes  The current method of family planning is ablation.    Exercising: yes  Home exercise routine includes stretching and treadmill. Smoker:  no  Health Maintenance: Pap:  08/11/11, WNL with negative HR HPV MMG:  09/14/12, Bi-Rads 1: negative Colonoscopy:  2013 TDaP:  07/20/10 Labs: HB:  13.1 Urine:  Negative    reports that she quit smoking about 2 years ago. Her smoking use included Cigarettes. She smoked 0.00 packs per day. She has never used smokeless tobacco. She reports that she drinks alcohol. She reports that she does not use illicit drugs.  Past Medical History  Diagnosis Date  . Recurrent herpes labialis   . Recurrent nephrolithiasis   . Complication of anesthesia     "doesn't wake up well"  . Anemia   . Gallstone   . PONV (postoperative nausea and vomiting)   . Internal/external hemorrhoids with bleeding 01/30/2012    Past Surgical History  Procedure Laterality Date  . Cholecystectomy  2008  . Tubal ligation  2000  . Laser ablation  2010    uterine ablation  . Lithotripsy  2008  . Mandible surgery  1985  . Cystoscopy/retrograde/ureteroscopy/stone extraction with basket  04/29/2011    Procedure: CYSTOSCOPY/RETROGRADE/URETEROSCOPY/STONE EXTRACTION WITH BASKET;  Surgeon: Ailene Rud, MD;  Location: [facility];  Service: Urology;  Laterality: Bilateral;  bilateral retrogrades, bilateral ureteroscopy, left stone basketry  . Mass excision  11/25/2011    right hand-neuroma removed  . Hemorrhoid surgery  2007  . Colonoscopy  01/30/2012    Procedure: COLONOSCOPY;  Surgeon: Gatha Mayer, MD;  Location: [facility];  Service: Endoscopy;  Laterality: N/A;  . Hand surgery  2013    Neuroma     Current Outpatient Prescriptions  Medication Sig Dispense Refill  . Multiple Vitamin (MULTIVITAMIN) tablet Take 1 tablet by mouth daily.      . valACYclovir (VALTREX) 1000 MG tablet For outbreak, take 1 tablet twice daily for 3 days, then 1 tablet once daily until fever blister is gone  30 tablet  12   No current facility-administered medications for this visit.    Family History  Problem Relation Age of Onset  . Coronary artery disease Father   . Diabetes Father   . Hyperlipidemia Father   . Coronary artery disease Paternal Grandmother   . Coronary artery disease Maternal Grandmother   . Colon cancer Maternal Grandfather   . Colon polyps Father   . Diverticulosis Father   . Lung cancer Paternal Grandfather     ROS:  Pertinent items are noted in HPI.  Otherwise, a comprehensive ROS was negative.  Exam:   BP 100/60  Pulse 64  Ht 5\' 4"  (1.626 m)  Wt 147 lb (66.679 kg)  BMI 25.22 kg/m2  LMP 01/04/2009 Height: 5\' 4"  (162.6 cm)  Ht Readings from Last 3 Encounters:  10/10/13 5\' 4"  (1.626 m)  05/01/12 5\' 4"  (1.626 m)  01/30/12 5\' 4"  (1.626 m)    General appearance: alert, cooperative and appears stated age Head: Normocephalic, without obvious abnormality, atraumatic Neck: no adenopathy, supple, symmetrical, trachea midline and thyroid normal to inspection and palpation Lungs: clear to auscultation bilaterally  Breasts: normal appearance, no masses or tenderness Heart: regular rate and rhythm Abdomen: soft, non-tender; no masses,  no organomegaly Extremities: extremities normal, atraumatic, no cyanosis or edema Skin: Skin color, texture, turgor normal. No rashes or lesions Lymph nodes: Cervical, supraclavicular, and axillary nodes normal. No abnormal inguinal nodes palpated Neurologic: Grossly normal   Pelvic: External genitalia:  no lesions              Urethra:  normal appearing urethra with no masses, tenderness or lesions              Bartholin's and Skene's:  normal                 Vagina: normal appearing vagina with normal color and discharge, no lesions              Cervix: anteverted              Pap taken: no Bimanual Exam:  Uterus:  normal size, contour, position, consistency, mobility, non-tender              Adnexa: no mass, fullness, tenderness               Rectovaginal: Confirms               Anus:  normal sphincter tone, no lesions  A:  Well Woman with normal exam  S/P BTL  S/P HTA 2010 with amenorrhea  P:   Reviewed health and wellness pertinent to exam  Pap smear not taken today  Mammogram due now and will schedule  Refill Valtrex for a year  Counseled on breast self exam, mammography screening, adequate intake of calcium and vitamin D, diet and exercise return annually or prn  An After Visit Summary was printed and given to the patient.

## 2013-10-13 NOTE — Progress Notes (Signed)
Encounter reviewed by Dr. Solomia Harrell Silva.  

## 2013-10-22 ENCOUNTER — Other Ambulatory Visit: Payer: Self-pay

## 2013-10-22 DIAGNOSIS — Z1231 Encounter for screening mammogram for malignant neoplasm of breast: Secondary | ICD-10-CM

## 2013-10-31 ENCOUNTER — Ambulatory Visit
Admission: RE | Admit: 2013-10-31 | Discharge: 2013-10-31 | Disposition: A | Discharge status: Home / Self Care | Payer: BC Managed Care – PPO | Source: Ambulatory Visit

## 2013-10-31 DIAGNOSIS — Z1231 Encounter for screening mammogram for malignant neoplasm of breast: Secondary | ICD-10-CM

## 2013-12-27 ENCOUNTER — Ambulatory Visit (INDEPENDENT_AMBULATORY_CARE_PROVIDER_SITE_OTHER)
Admission: RE | Admit: 2013-12-27 | Discharge: 2013-12-27 | Disposition: A | Discharge status: Home / Self Care | Payer: BC Managed Care – PPO | Source: Ambulatory Visit | Attending: Internal Medicine | Admitting: Internal Medicine

## 2013-12-27 ENCOUNTER — Encounter: Payer: Self-pay | Admitting: Internal Medicine

## 2013-12-27 ENCOUNTER — Ambulatory Visit (INDEPENDENT_AMBULATORY_CARE_PROVIDER_SITE_OTHER): Payer: BC Managed Care – PPO | Admitting: Internal Medicine

## 2013-12-27 VITALS — BP 120/80 | Temp 99.3°F | Ht 64.25 in | Wt 147.0 lb

## 2013-12-27 DIAGNOSIS — R52 Pain, unspecified: Secondary | ICD-10-CM

## 2013-12-27 DIAGNOSIS — R109 Unspecified abdominal pain: Secondary | ICD-10-CM

## 2013-12-27 DIAGNOSIS — R110 Nausea: Secondary | ICD-10-CM

## 2013-12-27 DIAGNOSIS — Z87442 Personal history of urinary calculi: Secondary | ICD-10-CM | POA: Insufficient documentation

## 2013-12-27 LAB — CBC WITH DIFFERENTIAL/PLATELET
BASOS PCT: 0.3 % (ref 0.0–3.0)
Basophils Absolute: 0 10*3/uL (ref 0.0–0.1)
EOS ABS: 0.1 10*3/uL (ref 0.0–0.7)
Eosinophils Relative: 0.7 % (ref 0.0–5.0)
HCT: 40.4 % (ref 36.0–46.0)
HEMOGLOBIN: 13.8 g/dL (ref 12.0–15.0)
LYMPHS PCT: 28.4 % (ref 12.0–46.0)
Lymphs Abs: 2.3 10*3/uL (ref 0.7–4.0)
MCHC: 34 g/dL (ref 30.0–36.0)
MCV: 94.4 fl (ref 78.0–100.0)
MONOS PCT: 6.6 % (ref 3.0–12.0)
Monocytes Absolute: 0.5 10*3/uL (ref 0.1–1.0)
NEUTROS ABS: 5.2 10*3/uL (ref 1.4–7.7)
NEUTROS PCT: 64 % (ref 43.0–77.0)
Platelets: 263 10*3/uL (ref 150.0–400.0)
RBC: 4.28 Mil/uL (ref 3.87–5.11)
RDW: 12.8 % (ref 11.5–15.5)
WBC: 8.2 10*3/uL (ref 4.0–10.5)

## 2013-12-27 LAB — POCT URINALYSIS DIP (MANUAL ENTRY)
BILIRUBIN UA: NEGATIVE
GLUCOSE UA: NEGATIVE
Ketones, POC UA: NEGATIVE
Leukocytes, UA: NEGATIVE
NITRITE UA: NEGATIVE
Protein Ur, POC: NEGATIVE
SPEC GRAV UA: 1.015
UROBILINOGEN UA: 2
pH, UA: 7.5

## 2013-12-27 LAB — BASIC METABOLIC PANEL
BUN: 13 mg/dL (ref 6–23)
CALCIUM: 9.6 mg/dL (ref 8.4–10.5)
CO2: 27 mEq/L (ref 19–32)
CREATININE: 0.9 mg/dL (ref 0.4–1.2)
Chloride: 103 mEq/L (ref 96–112)
GFR: 74.12 mL/min (ref >60.00)
Glucose, Bld: 83 mg/dL (ref 70–99)
Potassium: 4.2 mEq/L (ref 3.5–5.1)
SODIUM: 136 meq/L (ref 135–145)

## 2013-12-27 LAB — POCT URINE PREGNANCY: PREG TEST UR: NEGATIVE

## 2013-12-27 MED ORDER — IOHEXOL 300 MG/ML  SOLN
100.0000 mL | Freq: Once | INTRAMUSCULAR | Status: AC | PRN
  Administered 2013-12-27: 100 mL via INTRAVENOUS

## 2013-12-27 MED ORDER — TRAMADOL HCL 50 MG PO TABS: ORAL_TABLET | ORAL | Status: DC

## 2013-12-27 NOTE — Patient Instructions (Signed)
Blood and urine tests abd pelvic ct to check for appendicitis. ( wpont tell us if you have a gastritis or ulcer )  Will let you know results and plan accordingly

## 2013-12-27 NOTE — Progress Notes (Signed)
Pre visit review using our clinic review tool, if applicable. No additional management support is needed unless otherwise documented below in the visit note.  Chief Complaint  Patient presents with  . Abdominal Pain    Started yesterday morning.  Has tried antacids, gas x and herbal supplements.  No relief.  Has had bowel movements.  States her gut feels like "cement."  . Nausea    HPI: Patient comes in today for SDA for  new problem evaluation. Onset about 24 hours ago whole abdomen huets and nausea  Rad to back  Waxes and wanes  At low fat meal and felt worse .  Took tums  And worse .  Empty stomach is not as  Fine the day before.  Dec appetite  eating makes worse  Similar pain with GB  This is prolonged.  Hx of renal stone doesn't feel like this usually this way.  Pain 7/10 at worse and 3/10 better  ROS: See pertinent positives and negatives per HPI.  Night before had one glass of wine.  No recent. aleve.  No fever ? If so .  No uti .  No vomiting  . bm yesterday am ok  Hard to go  This am.   no menses had ablation and  BTL  Past Medical History  Diagnosis Date  . Recurrent herpes labialis   . Recurrent nephrolithiasis   . Complication of anesthesia     "doesn't wake up well"  . Anemia   . Gallstone   . PONV (postoperative nausea and vomiting)   . Internal/external hemorrhoids with bleeding 01/30/2012    Family History  Problem Relation Age of Onset  . Coronary artery disease Father   . Diabetes Father   . Hyperlipidemia Father   . Coronary artery disease Paternal Grandmother   . Coronary artery disease Maternal Grandmother   . Colon cancer Maternal Grandfather   . Colon polyps Father   . Diverticulosis Father   . Lung cancer Paternal Grandfather     History   Social History  . Marital Status: Divorced    Spouse Name: N/A    Number of Children: N/A  . Years of Education: N/A   Social History Main Topics  . Smoking status: Former Smoker    Types: Cigarettes    Quit date: 04/26/2011  . Smokeless tobacco: Never Used  . Alcohol Use: Yes     Comment: occasional- couple times a year  . Drug Use: No  . Sexual Activity: Yes    Birth Control/ Protection: Surgical   Other Topics Concern  . None   Social History Narrative   Former smoker   [place] of 2   Pet dog and GP and [employer]   Works [place] lives out town in [place]   Daily caffeine    Outpatient Encounter Prescriptions as of 12/27/2013  Medication Sig  . Multiple Vitamin (MULTIVITAMIN) tablet Take 1 tablet by mouth daily.  . valACYclovir (VALTREX) 1000 MG tablet For outbreak, take 1 tablet twice daily for 3 days, then 1 tablet once daily until fever blister is gone    EXAM:  BP 120/80  Temp(Src) 99.3 F (37.4 C) (Oral)  Ht 5' 4.25" (1.632 m)  Wt 147 lb (66.679 kg)  BMI 25.04 kg/m2  Body mass index is 25.04 kg/(m^2).  GENERAL: vitals reviewed and listed above, alert, oriented, appears well hydrated and in no acute distress initially although alter in visit had more sever pain epigastric luqmid? HEENT: atraumatic,  conjunctiva  clear, no obvious abnormalities on inspection of external nose and ears OP : no lesion edema or exudate  NECK: no obvious masses on inspection palpation  LUNGS: clear to auscultation bilaterally, no wheezes, rales or rhonchi, good air movement CV: HRRR, no clubbing cyanosis or  peripheral edema nl cap refill  Abdomen:  Sof,t normal  To decrease bowel sounds without hepatosplenomegaly, no guarding rebound or masses no CVA tenderness  Tender mid abdomen periumbilical and  RLQ no rebound neg psoas no rash  MS: moves all extremities without noticeable focal  abnormality PSYCH: pleasant and cooperative, in some pain  ASSESSMENT AND PLAN:  Discussed the following assessment and plan:  Abdominal pain, acute - periumbilical but rlq tenderness consider early apopendicitis   less likely renal stone but possible - Plan: Basic metabolic panel, CBC with Differential,  POCT urinalysis dipstick, POCT urine pregnancy, CT Abdomen Pelvis W Contrast, CT Abdomen Pelvis Wo Contrast  Nausea alone - with anorexia failty sudden onset  - Plan: Basic metabolic panel, CBC with Differential, POCT urinalysis dipstick, POCT urine pregnancy, CT Abdomen Pelvis W Contrast, CT Abdomen Pelvis Wo Contrast  Hx of urinary stone - Plan: CT Abdomen Pelvis Wo Contrast Stat labs and ct scan with and without  .  -Patient advised to return or notify health care team  if symptoms worsen ,persist or new concerns arise.  Patient Instructions  Blood and urine tests abd pelvic ct to check for appendicitis. ( wpont tell us if you have a gastritis or ulcer )  Will let you know results and plan accordingly   Mariann Laster K. Darrel Baroni M.D.  Disc with patient no appendicitis  No stone on right pelvic congestion syndrome  Ovarian cyst fibroid prilosec tramadol and fu next week.

## 2013-12-30 ENCOUNTER — Telehealth: Payer: Self-pay | Admitting: Nurse Practitioner

## 2013-12-30 NOTE — Telephone Encounter (Signed)
Routing to [employer], FNP for review and advice. 2.2 cm Fibroid on CT scan. Does pt need office visit with you or MD for evaluation?   Spoke with patient to advise sending message to [employer], [place] and would call her back. Patient states she is feeling improved.

## 2013-12-30 NOTE — Telephone Encounter (Signed)
Patient had ct done on Friday done on abdominal area due to pain. She wants patty to pull it up because it was done in epic and see if it is anything that she may need to come here for. Said she had it done at [facility]. They said that nothing in the ct looked like there was a problem they told her to follow up with her gyn doctor.

## 2013-12-31 NOTE — Telephone Encounter (Signed)
Called pt.  No answer on cell phone.  Left message for pt that there was nothing worrisome on CT.  Will try to call back again today.

## 2013-12-31 NOTE — Telephone Encounter (Signed)
Patient calling this am very upset that no one called her back yesterday to let her know if the findings of her CT scan are anything to be concerned with.  She has been taking the Tramadol from dr Regis Bill and pain is improving but she feels frustrated that she could not speak directly to [facility] yesterday and that no one follow-up with her all day Apologized that we did not meet her expectation. Offered OV with MD to review or can personally review this with MD around 12 noon (they are currently in surgery) to see if recommend appointment now or in a few weeks to reassess cyst. Advised ovarian cysts are not uncommon and since she is feeling better, they may say we can wait. Last AEX 10-2013 pelvic exam had normal size uterus so will have MD review to see about evaluating possible fibroid. Patient prefers to have someone review CT report first and call back with recommendation.Advised i will personally call her back by end of day.    Routing to Dr Sabra Heck for instruction. Patty, just wanted to keep you updated.

## 2014-01-01 NOTE — Telephone Encounter (Signed)
Spoke with pt personally.  Explained CT scan showed small 1.7 and 1.8cm cysts on ovaries that do not appear worrisome.  2.2cm uterine fibroid seen as well.  H/O endometrial ablation in pt.  She does not cycle at all.  D/W pt most common issue with fibroids is bleeding.  She has none.  Pt very grateful for phone call.  Spent about 15 minutes on call answering all questions.  Spoke with her yesterday 12/31/13 around 5:45pm.  She has follow up with PCP in two weeks and will keep appt.  Pain has completely resolved.  Encounter closed.

## 2014-03-12 ENCOUNTER — Ambulatory Visit (INDEPENDENT_AMBULATORY_CARE_PROVIDER_SITE_OTHER): Payer: BC Managed Care – PPO | Admitting: Internal Medicine

## 2014-03-12 ENCOUNTER — Encounter: Payer: Self-pay | Admitting: Internal Medicine

## 2014-03-12 VITALS — BP 110/70 | Temp 98.5°F | Wt 143.0 lb

## 2014-03-12 DIAGNOSIS — L02419 Cutaneous abscess of limb, unspecified: Secondary | ICD-10-CM

## 2014-03-12 DIAGNOSIS — L03112 Cellulitis of left axilla: Secondary | ICD-10-CM

## 2014-03-12 MED ORDER — CEFTRIAXONE SODIUM 1 G IJ SOLR
1.0000 g | INTRAMUSCULAR | Status: AC
  Administered 2014-03-12: 1 g via INTRAMUSCULAR

## 2014-03-12 MED ORDER — DOXYCYCLINE HYCLATE 100 MG PO TABS: 100.0000 mg | ORAL_TABLET | Freq: Two times a day (BID) | ORAL | Status: DC

## 2014-03-12 NOTE — Progress Notes (Signed)
Chief Complaint  Patient presents with  . lump    left armpit, arm    HPI: Patient Meghan Frank  comes in today for SDA for  new problem evaluation. Has had some swelling under her left armpit that was a bit tender and then this weekend squeezed it to try to get drainage out of it but instead it got bigger and more tender. There couple other nontender her left armpit only a few are tender. She can move her arm but it feels swollen and sore no associated fever or chills. No other treatment. Has not had anything like this before.  ROS: See pertinent positives and negatives per HPI. She is allergic to sulfa no nausea vomiting other intolerance no other unusual rashes  Past Medical History  Diagnosis Date  . Recurrent herpes labialis   . Recurrent nephrolithiasis   . Complication of anesthesia     "doesn't wake up well"  . Anemia   . Gallstone   . PONV (postoperative nausea and vomiting)   . Internal/external hemorrhoids with bleeding 01/30/2012    Family History  Problem Relation Age of Onset  . Coronary artery disease Father   . Diabetes Father   . Hyperlipidemia Father   . Coronary artery disease Paternal Grandmother   . Coronary artery disease Maternal Grandmother   . Colon cancer Maternal Grandfather   . Colon polyps Father   . Diverticulosis Father   . Lung cancer Paternal Grandfather     History   Social History  . Marital Status: Divorced    Spouse Name: N/A    Number of Children: N/A  . Years of Education: N/A   Social History Main Topics  . Smoking status: Former Smoker    Types: Cigarettes    Quit date: 04/26/2011  . Smokeless tobacco: Never Used  . Alcohol Use: Yes     Comment: occasional- couple times a year  . Drug Use: No  . Sexual Activity: Yes    Birth Control/ Protection: Surgical   Other Topics Concern  . None   Social History Narrative   Former smoker   [place] of 2   Pet dog and GP and [employer]   Works [place] lives out town in  [place]   Daily caffeine    Outpatient Encounter Prescriptions as of 03/12/2014  Medication Sig  . Multiple Vitamin (MULTIVITAMIN) tablet Take 1 tablet by mouth daily.  . valACYclovir (VALTREX) 1000 MG tablet For outbreak, take 1 tablet twice daily for 3 days, then 1 tablet once daily until fever blister is gone  . [DISCONTINUED] traMADol (ULTRAM) 50 MG tablet Take every 6 hours as needed for abdominal pain.  Marland Kitchen doxycycline (VIBRA-TABS) 100 MG tablet Take 1 tablet (100 mg total) by mouth 2 (two) times daily.  . [EXPIRED] cefTRIAXone (ROCEPHIN) injection 1 g     EXAM:  BP 110/70  Temp(Src) 98.5 F (36.9 C) (Oral)  Wt 143 lb (64.864 kg)  Body mass index is 24.35 kg/(m^2).  GENERAL: vitals reviewed and listed above, alert, oriented, appears well hydrated and in no acute distress HEENT: atraumatic, conjunctiva  clear, no obvious abnormalities on inspection of external nose and earsNECK: no obvious masses on inspection palpation  Left axilla with 3 cm plus abscess nodule proximal arm near axilla skin has some follicular rash in the axilla and 3 small marblelike nodules only one is tender. There is about 8 cm of redness pinkness down the arm about halfway to the elbow.  No induration at that point MS: moves all other extremities without noticeable focal  abnormality PSYCH: pleasant and cooperative, no obvious depression or anxiety   Discussed differential diagnosis in incision and drainage antibiotic close followup if persistent that sometimes these cysts or abscesses can be deeper and need to be evaluated by surgery however today she should be able to improve with antibiotics and incision and drainage.   1.5 cc of 1% lidocaine with epi 25-gauge needle over around abscess  "a #11 blade a incision 3 mm to 4 mm was made medium amount of pus yellow discharge was released and then expressed. She tolerated this well. Is also given IM Rocephin 1 g ASSESSMENT AND PLAN:  Discussed the following  assessment and plan:  Abscess of axillary region - left . streaking redness  . add antibiotic local  i and d  ed surgery if worse - Plan: Wound culture, cefTRIAXone (ROCEPHIN) injection 1 g  Cellulitis of axilla, left Local care warm compresses oral antibiotics in addition to IM Rocephin today recheck in 2 days unless she is so much better she can call us about her status. Suspect this started from folliculitis and got worse progressive persistent get surgery to see. -Patient advised to return or notify health care team  if symptoms worsen ,persist or new concerns arise.  Patient Instructions  Hot compresses moist  Great if drains more. Antibiotic   recheck Friday   If fever increasing redness and worsening  Call on call service or go to ED. For emergency care .   Do not shave in this area    Abscess An abscess is an infected area that contains a collection of pus and debris.It can occur in almost any part of the body. An abscess is also known as a furuncle or boil. CAUSES  An abscess occurs when tissue gets infected. This can occur from blockage of oil or sweat glands, infection of hair follicles, or a minor injury to the skin. As the body tries to fight the infection, pus collects in the area and creates pressure under the skin. This pressure causes pain. People with weakened immune systems have difficulty fighting infections and get certain abscesses more often.  SYMPTOMS Usually an abscess develops on the skin and becomes a painful mass that is red, warm, and tender. If the abscess forms under the skin, you may feel a moveable soft area under the skin. Some abscesses break open (rupture) on their own, but most will continue to get worse without care. The infection can spread deeper into the body and eventually into the bloodstream, causing you to feel ill.  DIAGNOSIS  Your caregiver will take your medical history and perform a physical exam. A sample of fluid may also be taken from the  abscess to determine what is causing your infection. TREATMENT  Your caregiver may prescribe antibiotic medicines to fight the infection. However, taking antibiotics alone usually does not cure an abscess. Your caregiver may need to make a small cut (incision) in the abscess to drain the pus. In some cases, gauze is packed into the abscess to reduce pain and to continue draining the area. HOME CARE INSTRUCTIONS   Only take over-the-counter or prescription medicines for pain, discomfort, or fever as directed by your caregiver.  If you were prescribed antibiotics, take them as directed. Finish them even if you start to feel better.  If gauze is used, follow your caregiver's directions for changing the gauze.  To avoid spreading the infection:  Keep your draining abscess covered with a bandage.  Wash your hands well.  Do not share personal care items, towels, or whirlpools with others.  Avoid skin contact with others.  Keep your skin and clothes clean around the abscess.  Keep all follow-up appointments as directed by your caregiver. SEEK MEDICAL CARE IF:   You have increased pain, swelling, redness, fluid drainage, or bleeding.  You have muscle aches, chills, or a general ill feeling.  You have a fever. MAKE SURE YOU:   Understand these instructions.  Will watch your condition.  Will get help right away if you are not doing well or get worse. Document Released: 03/02/2005 Document Revised: 11/22/2011 Document Reviewed: 08/05/2011 [facility] Patient Information 2015 [place], [place]. This information is not intended to replace advice given to you by your health care provider. Make sure you discuss any questions you have with your health care provider.      Standley Brooking. Cedarius Kersh M.D.

## 2014-03-12 NOTE — Patient Instructions (Signed)
Hot compresses moist  Great if drains more. Antibiotic   recheck Friday   If fever increasing redness and worsening  Call on call service or go to ED. For emergency care .   Do not shave in this area    Abscess An abscess is an infected area that contains a collection of pus and debris.It can occur in almost any part of the body. An abscess is also known as a furuncle or boil. CAUSES  An abscess occurs when tissue gets infected. This can occur from blockage of oil or sweat glands, infection of hair follicles, or a minor injury to the skin. As the body tries to fight the infection, pus collects in the area and creates pressure under the skin. This pressure causes pain. People with weakened immune systems have difficulty fighting infections and get certain abscesses more often.  SYMPTOMS Usually an abscess develops on the skin and becomes a painful mass that is red, warm, and tender. If the abscess forms under the skin, you may feel a moveable soft area under the skin. Some abscesses break open (rupture) on their own, but most will continue to get worse without care. The infection can spread deeper into the body and eventually into the bloodstream, causing you to feel ill.  DIAGNOSIS  Your caregiver will take your medical history and perform a physical exam. A sample of fluid may also be taken from the abscess to determine what is causing your infection. TREATMENT  Your caregiver may prescribe antibiotic medicines to fight the infection. However, taking antibiotics alone usually does not cure an abscess. Your caregiver may need to make a small cut (incision) in the abscess to drain the pus. In some cases, gauze is packed into the abscess to reduce pain and to continue draining the area. HOME CARE INSTRUCTIONS   Only take over-the-counter or prescription medicines for pain, discomfort, or fever as directed by your caregiver.  If you were prescribed antibiotics, take them as directed. Finish them  even if you start to feel better.  If gauze is used, follow your caregiver's directions for changing the gauze.  To avoid spreading the infection:  Keep your draining abscess covered with a bandage.  Wash your hands well.  Do not share personal care items, towels, or whirlpools with others.  Avoid skin contact with others.  Keep your skin and clothes clean around the abscess.  Keep all follow-up appointments as directed by your caregiver. SEEK MEDICAL CARE IF:   You have increased pain, swelling, redness, fluid drainage, or bleeding.  You have muscle aches, chills, or a general ill feeling.  You have a fever. MAKE SURE YOU:   Understand these instructions.  Will watch your condition.  Will get help right away if you are not doing well or get worse. Document Released: 03/02/2005 Document Revised: 11/22/2011 Document Reviewed: 08/05/2011 [facility] Patient Information 2015 [place], [place]. This information is not intended to replace advice given to you by your health care provider. Make sure you discuss any questions you have with your health care provider.

## 2014-03-12 NOTE — Progress Notes (Signed)
Pre visit review using our clinic review tool, if applicable. No additional management support is needed unless otherwise documented below in the visit note. 

## 2014-03-14 ENCOUNTER — Ambulatory Visit (INDEPENDENT_AMBULATORY_CARE_PROVIDER_SITE_OTHER): Payer: BC Managed Care – PPO | Admitting: Internal Medicine

## 2014-03-14 ENCOUNTER — Encounter: Payer: Self-pay | Admitting: Internal Medicine

## 2014-03-14 VITALS — BP 120/60 | HR 78 | Temp 98.9°F | Wt 142.2 lb

## 2014-03-14 DIAGNOSIS — L02419 Cutaneous abscess of limb, unspecified: Secondary | ICD-10-CM

## 2014-03-14 DIAGNOSIS — A4901 Methicillin susceptible Staphylococcus aureus infection, unspecified site: Secondary | ICD-10-CM | POA: Insufficient documentation

## 2014-03-14 NOTE — Progress Notes (Signed)
Pre visit review using our clinic review tool, if applicable. No additional management support is needed unless otherwise documented below in the visit note.   Chief Complaint  Patient presents with  . Follow-up    abscess under (L) arm    HPI: Meghan Frank comes in for today followup after abscesses drainage cellulitis under the left armpit with reactive adenopathy. Patient states pain is down no fever redness is down still gets some drainage knots in the axillary area or about the same nontender. Taking antibiotic as directed. ROS: See pertinent positives and negatives per HPI.  Past Medical History  Diagnosis Date  . Recurrent herpes labialis   . Recurrent nephrolithiasis   . Complication of anesthesia     "doesn't wake up well"  . Anemia   . Gallstone   . PONV (postoperative nausea and vomiting)   . Internal/external hemorrhoids with bleeding 01/30/2012    Family History  Problem Relation Age of Onset  . Coronary artery disease Father   . Diabetes Father   . Hyperlipidemia Father   . Coronary artery disease Paternal Grandmother   . Coronary artery disease Maternal Grandmother   . Colon cancer Maternal Grandfather   . Colon polyps Father   . Diverticulosis Father   . Lung cancer Paternal Grandfather     History   Social History  . Marital Status: Divorced    Spouse Name: N/A    Number of Children: N/A  . Years of Education: N/A   Social History Main Topics  . Smoking status: Former Smoker    Types: Cigarettes    Quit date: 04/26/2011  . Smokeless tobacco: Never Used  . Alcohol Use: Yes     Comment: occasional- couple times a year  . Drug Use: No  . Sexual Activity: Yes    Birth Control/ Protection: Surgical   Other Topics Concern  . None   Social History Narrative   Former smoker   [place] of 2   Pet dog and GP and [employer]   Works [place] lives out town in [place]   Daily caffeine    Outpatient Encounter Prescriptions as of 03/14/2014   Medication Sig  . doxycycline (VIBRA-TABS) 100 MG tablet Take 1 tablet (100 mg total) by mouth 2 (two) times daily.  . Multiple Vitamin (MULTIVITAMIN) tablet Take 1 tablet by mouth daily.  . valACYclovir (VALTREX) 1000 MG tablet For outbreak, take 1 tablet twice daily for 3 days, then 1 tablet once daily until fever blister is gone    EXAM:  BP 120/60  Pulse 78  Temp(Src) 98.9 F (37.2 C) (Oral)  Wt 142 lb 4 oz (64.524 kg)  SpO2 99%  Body mass index is 24.23 kg/(m^2).  GENERAL: vitals reviewed and listed above, alert, oriented, appears well hydrated and in no acute distress  Left axilla nodular area still present about 1 cm smooth soft flesh-colored cellulitis skin coloring is almost all faded they're still 3 small pea-sized nodular mobile probable lymph nodes in the axilla. Skin is otherwise improved. She can move her arm without restriction. Culture of wound shows moderate staph aureus sensitivities are pending ASSESSMENT AND PLAN:  Discussed the following assessment and plan:  Staphylococcus aureus infection  Abscess of axillary region - improved  continue fu if needed ? if not resolved  adenopathy  or cyst mass effect remains .   -Patient advised to return or notify health care team  if symptoms worsen ,persist or new concerns arise.  Patient  Instructions  Continue same antibiotic . As discussed and local care . Can recheck  If  persistent or progressive no shaving right now . Until better then clean razor  And shaving gel.    Standley Brooking. Panosh M.D.

## 2014-03-14 NOTE — Patient Instructions (Signed)
Continue same antibiotic . As discussed and local care . Can recheck  If  persistent or progressive no shaving right now . Until better then clean razor  And shaving gel.

## 2014-03-15 LAB — WOUND CULTURE
GRAM STAIN: NONE SEEN
Gram Stain: NONE SEEN

## 2014-03-17 ENCOUNTER — Ambulatory Visit: Payer: BC Managed Care – PPO | Admitting: Internal Medicine

## 2014-04-07 ENCOUNTER — Encounter: Payer: Self-pay | Admitting: Internal Medicine

## 2014-04-21 ENCOUNTER — Telehealth: Payer: Self-pay | Admitting: Internal Medicine

## 2014-04-21 NOTE — Telephone Encounter (Signed)
Needs to be triaged.  Send to [facility].  Thanks!

## 2014-04-21 NOTE — Telephone Encounter (Signed)
Pt called to say that lymph node under her arm is still swollen. Pt would like to know if she need to come in or if she need to wait a little longer

## 2014-04-23 NOTE — Telephone Encounter (Signed)
Per misty this pt needs to be triage. Pt call today and I  transferred to [facility]

## 2014-04-23 NOTE — Telephone Encounter (Signed)
Patient Information:  Caller Name: Wealthy  Phone: [phone]  Patient: Meghan Frank, Meghan Frank  Gender: Female  DOB: 12/22/1965  Age: 48 Years  PCP: Shanon Ace  Pregnant: No  Office Follow Up:  Does the office need to follow up with this patient?: No  Instructions For The Office: N/A  RN Note:  Hx of uterine ablation.  Suggested application of warm compresses for tender, left auxilla lymph node.  Pt verbalized she is extremely upset that she called to speak with Dr Velora Mediate nurse 04/21/14 and but did not receive a call back.  When she called two days later, on11/18/15, she was told she had to speak with nurse triage.  Reported she is upset with lack of care by office staff; mentioned  considering transferring records to another provider after 20 years with Dr. Regis Bill.  RN apologized for lack of call back 04/21/14. Suggested she further discuss her dissatisfaction with care with MD, when seen.  Explained nurse triage is used to determines how soon she should be seen.  Informed must be seen for antibiotics per MD protocol.  Verbalized undstanding.  Requested appointment for 04/24/14.   Symptoms  Reason For Call & Symptoms: Ongoing tenderness in left auxilla and enlarged lymph node since I&D  done about 1 month ago.    No redness seen.  Reviewed Health History In EMR: Yes  Reviewed Medications In EMR: Yes  Reviewed Allergies In EMR: Yes  Reviewed Surgeries / Procedures: Yes  Date of Onset of Symptoms: 03/14/2014  Treatments Tried: I&D in office 03/14/14, continued to squewwe lesion until closed up  Treatments Tried Worked: No OB / GYN:  LMP: Unknown  Guideline(s) Used:  Lymph Nodes - Swollen  Disposition Per Guideline:   See Today or Tomorrow in Office  Reason For Disposition Reached:   Very tender to the touch but no fever  Advice Given:  Call Back If:  Node enlarges to more than 1 inch (2.5 cm) in size  Overlying skin becomes red  Fever more than 103 F (39.4 C) occurs  You  become worse or are worried about a lymph node.  Patient Will Follow Care Advice:  YES  Appointment Scheduled:  04/24/2014 09:45:00 Appointment Scheduled Provider:  Shanon Ace [facility])

## 2014-04-24 ENCOUNTER — Ambulatory Visit (INDEPENDENT_AMBULATORY_CARE_PROVIDER_SITE_OTHER): Payer: BC Managed Care – PPO | Admitting: Internal Medicine

## 2014-04-24 ENCOUNTER — Ambulatory Visit (INDEPENDENT_AMBULATORY_CARE_PROVIDER_SITE_OTHER): Payer: BC Managed Care – PPO | Admitting: Family Medicine

## 2014-04-24 ENCOUNTER — Encounter: Payer: Self-pay | Admitting: Internal Medicine

## 2014-04-24 VITALS — BP 140/80 | Temp 98.3°F | Ht 64.25 in | Wt 141.5 lb

## 2014-04-24 DIAGNOSIS — Z23 Encounter for immunization: Secondary | ICD-10-CM

## 2014-04-24 DIAGNOSIS — R2230 Localized swelling, mass and lump, unspecified upper limb: Secondary | ICD-10-CM | POA: Insufficient documentation

## 2014-04-24 DIAGNOSIS — R2232 Localized swelling, mass and lump, left upper limb: Secondary | ICD-10-CM

## 2014-04-24 DIAGNOSIS — Z8619 Personal history of other infectious and parasitic diseases: Secondary | ICD-10-CM

## 2014-04-24 NOTE — Progress Notes (Signed)
Pre visit review using our clinic review tool, if applicable. No additional management support is needed unless otherwise documented below in the visit note.  Chief Complaint  Patient presents with  . Follow-up    Abcess of axillary region.    HPI: Meghan Frank 48 y.o.  rx for staph abscess cellulitis   In October ID and antibiotic   Had scattered ln cyst? In the area . Versus a lymph node. It's a bit tender to touch but she doesn't notice it when not touching no fever. Concerned if it could be something more important or from her breast when she had a breast infection on the right after childbirth. She is shaving with shaving gel cream uncertain follow-up asks advice  Initially declined flu vaccine she never gets them ROS: See pertinent positives and negatives per HPI.  Past Medical History  Diagnosis Date  . Recurrent herpes labialis   . Recurrent nephrolithiasis   . Complication of anesthesia     "doesn't wake up well"  . Anemia   . Gallstone   . PONV (postoperative nausea and vomiting)   . Internal/external hemorrhoids with bleeding 01/30/2012    Family History  Problem Relation Age of Onset  . Coronary artery disease Father   . Diabetes Father   . Hyperlipidemia Father   . Coronary artery disease Paternal Grandmother   . Coronary artery disease Maternal Grandmother   . Colon cancer Maternal Grandfather   . Colon polyps Father   . Diverticulosis Father   . Lung cancer Paternal Grandfather     History   Social History  . Marital Status: Divorced    Spouse Name: N/A    Number of Children: N/A  . Years of Education: N/A   Social History Main Topics  . Smoking status: Former Smoker    Types: Cigarettes    Quit date: 04/26/2011  . Smokeless tobacco: Never Used  . Alcohol Use: Yes     Comment: occasional- couple times a year  . Drug Use: No  . Sexual Activity: Yes    Birth Control/ Protection: Surgical   Other Topics Concern  . None   Social History  Narrative   Former smoker   [place] of 2   Pet dog and GP and [employer]   Works [employer] lives out town in [place]   Daily caffeine    Outpatient Encounter Prescriptions as of 04/24/2014  Medication Sig  . Multiple Vitamin (MULTIVITAMIN) tablet Take 1 tablet by mouth daily.  . valACYclovir (VALTREX) 1000 MG tablet For outbreak, take 1 tablet twice daily for 3 days, then 1 tablet once daily until fever blister is gone  . [DISCONTINUED] doxycycline (VIBRA-TABS) 100 MG tablet Take 1 tablet (100 mg total) by mouth 2 (two) times daily.    EXAM:  BP 140/80 mmHg  Temp(Src) 98.3 F (36.8 C) (Oral)  Ht 5' 4.25" (1.632 m)  Wt 141 lb 8 oz (64.184 kg)  BMI 24.10 kg/m2  Body mass index is 24.1 kg/(m^2).  GENERAL: vitals reviewed and listed above, alert, oriented, appears well hydrated and in no acute distress HEENT: atraumatic, conjunctiva  clear, no obvious abnormalities on inspection of external nose and ears Well-appearing in no acute distress examination of left axilla and lateral part of breast is normal except for a small three-quarter centimeter smooth ovoid superficial nodule lymph node versus cyst without redness or change in skin. In the middle of her left axilla   MS: moves all extremities without noticeable  focal  abnormality PSYCH: pleasant and cooperative, no obvious depression or anxiety  ASSESSMENT AND PLAN:  Discussed the following assessment and plan:  Lump of axilla, left - reactive ln vs cyst small mobile but firm sp sever axillary infection  optinos disc   Hx of staphylococcal infection - msss left axill abscess Lymph node superficial versus cyst but probably lymph gland is very small ovoid mobile smooth but  firm. Minimally tender. I see no other major skin reaction today although may have had some folliculitis from shaving. No other masses. Options discussed such as observation getting ultrasound surgical opinion but she had a quite severe infection and I suppose this is  residual is getting larger again red infected-looking we may add an antibiotic and follow-up or further evaluation. If getting to one and a half centimeters in above should be reevaluated. Counseled about getting flu vaccine will get one today. Risk benefit discussed -Patient advised to return or notify health care team  if symptoms worsen ,persist or new concerns arise.  Patient Instructions  Cyst or Ln  small if getting larger 1.5 cm or so .   Limit irritation to the axilla .  Folliculitis can flare this area . Warm compressed notify us if enlarging  Red etc . Can use My chart e messaging  If not emergent to get through     [place]. Nashalie Sallis M.D.

## 2014-04-24 NOTE — Patient Instructions (Signed)
Cyst or Ln  small if getting larger 1.5 cm or so .   Limit irritation to the axilla .  Folliculitis can flare this area . Warm compressed notify us if enlarging  Red etc . Can use My chart e messaging  If not emergent to get through

## 2014-07-11 ENCOUNTER — Encounter: Payer: Self-pay | Admitting: Nurse Practitioner

## 2014-08-30 IMAGING — CT CT ABD-PELV W/ CM
2 of 5 series · 17 of 46 positions shown, 19 images · IV contrast (omnipaque)
Comparison: CT scan of April 27, 2011.

CLINICAL DATA: Acute abdominal pain.

EXAM:
CT ABDOMEN AND PELVIS WITH CONTRAST
TECHNIQUE: Multidetector CT imaging of the abdomen and pelvis was performed
using the standard protocol following bolus administration of
intravenous contrast.
CONTRAST:  100mL OMNIPAQUE IOHEXOL 300 MG/ML  SOLN

[Series 2: abd/ pel 5mm · axial · 0.58mm/px · z∈[-430,-40]mm · 14 of 88 slices shown, 16 images]
[im 5/88  soft-tissue]
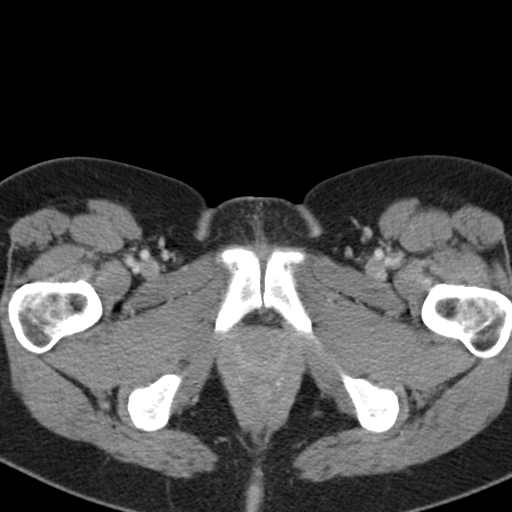
[im 5/88  bone]
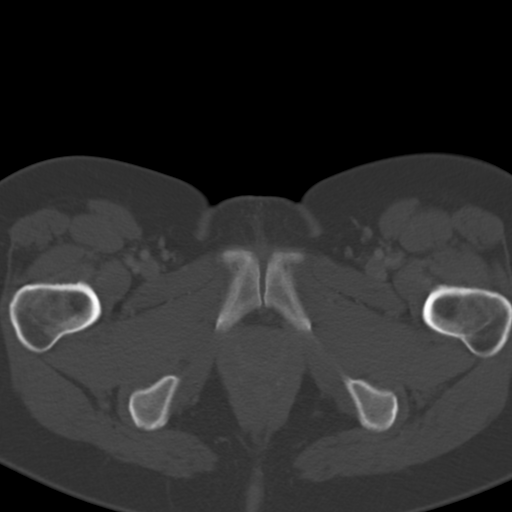
[im 14/88  soft-tissue]
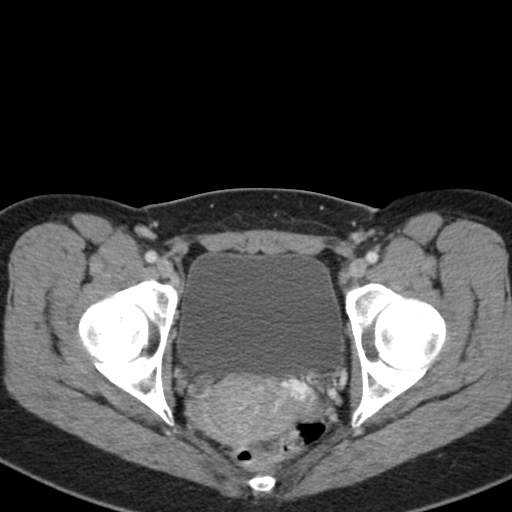
[im 18/88  soft-tissue]
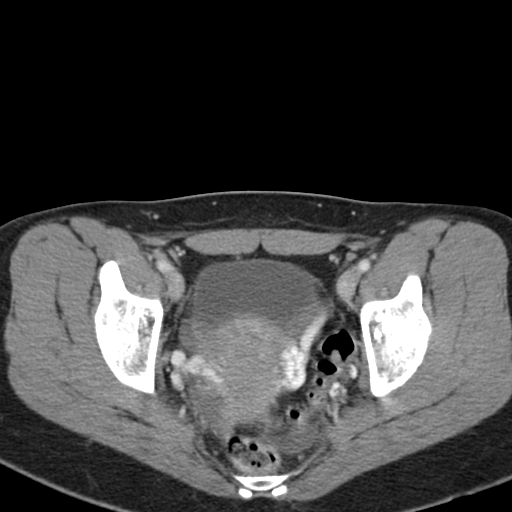
[im 22/88  soft-tissue]
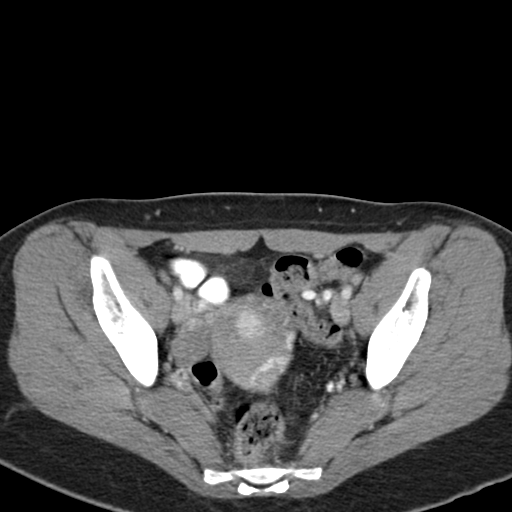
[im 31/88  soft-tissue]
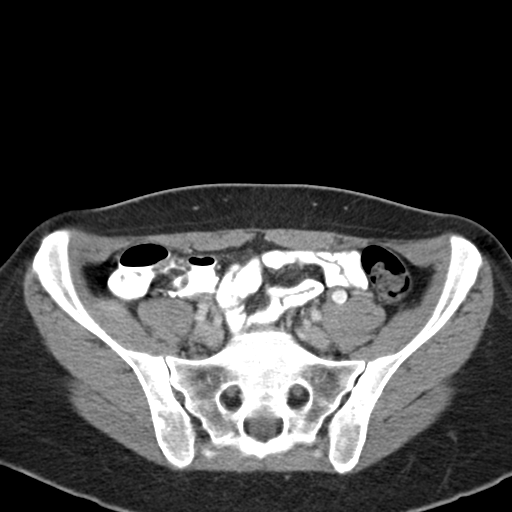
[im 35/88  soft-tissue]
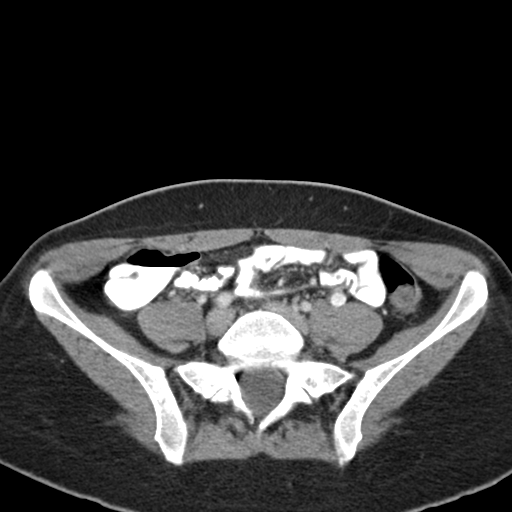
[im 40/88  soft-tissue]
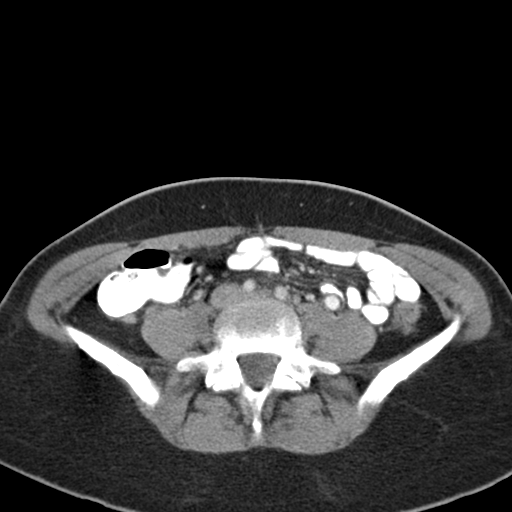
[im 48/88  soft-tissue]
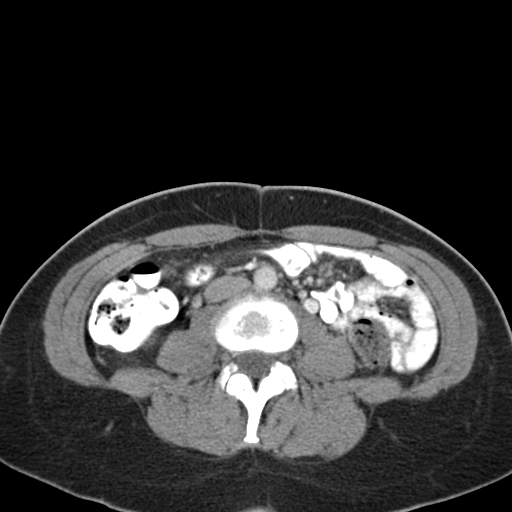
[im 53/88  soft-tissue]
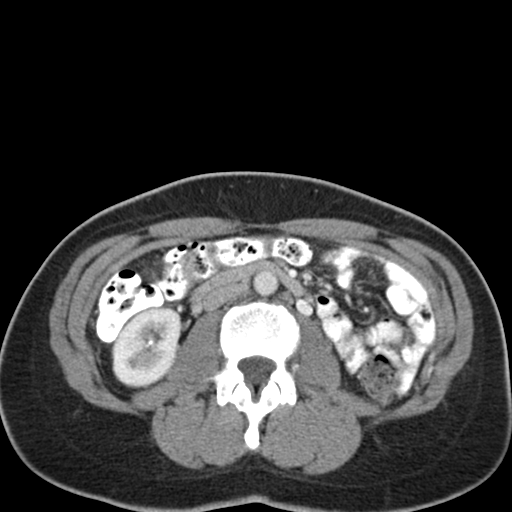
[im 53/88  bone]
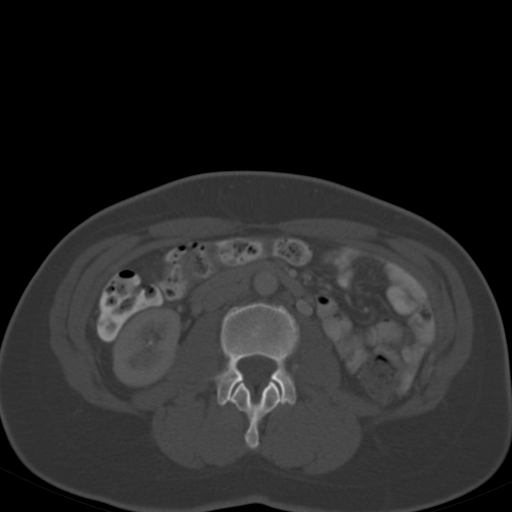
[im 57/88  soft-tissue]
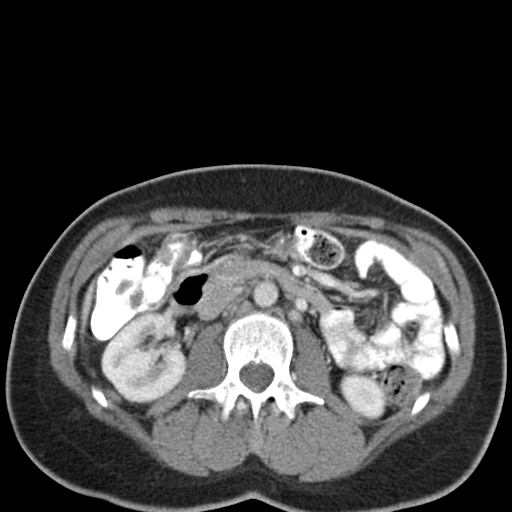
[im 66/88  soft-tissue]
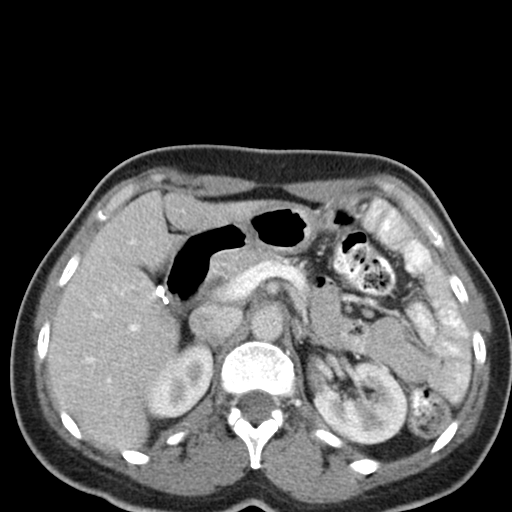
[im 70/88  soft-tissue]
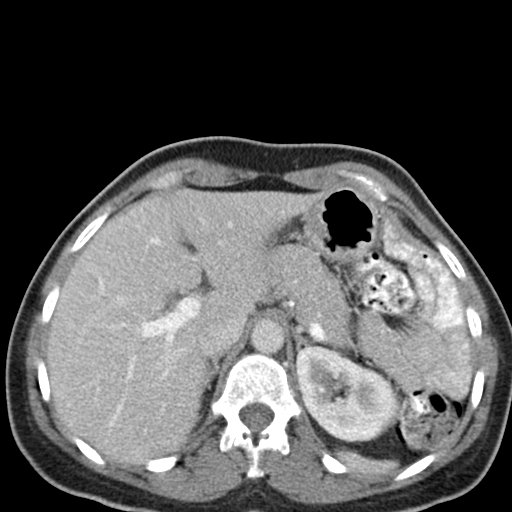
[im 74/88  soft-tissue]
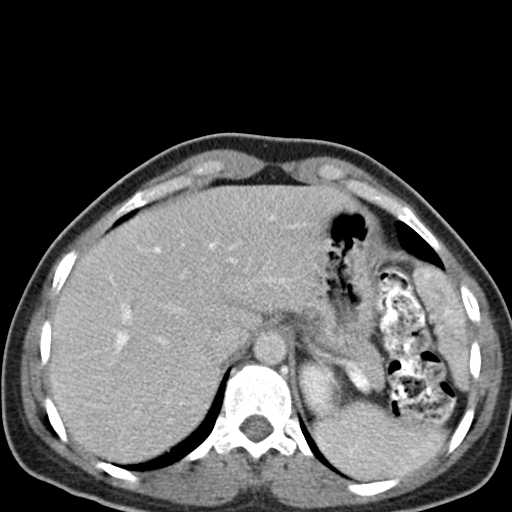
[im 83/88  soft-tissue]
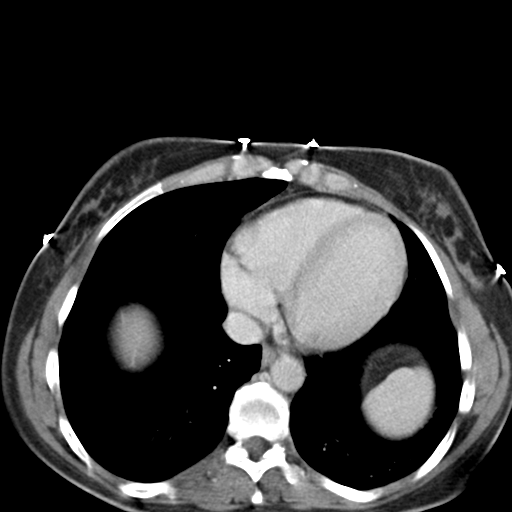

[Series 602: cor · coronal · 0.89mm/px · 3 of 101 slices shown]
[im 34/101  soft-tissue]
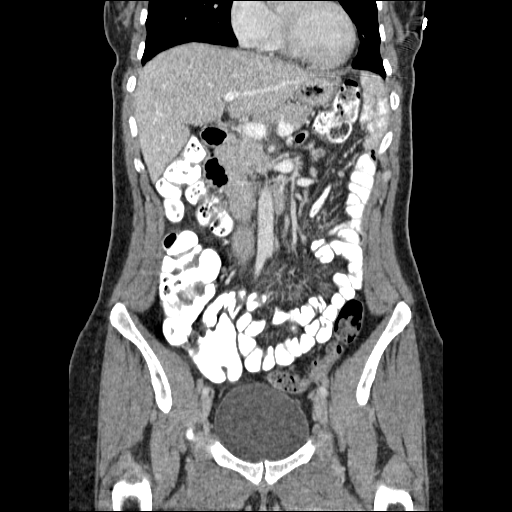
[im 45/101  soft-tissue]
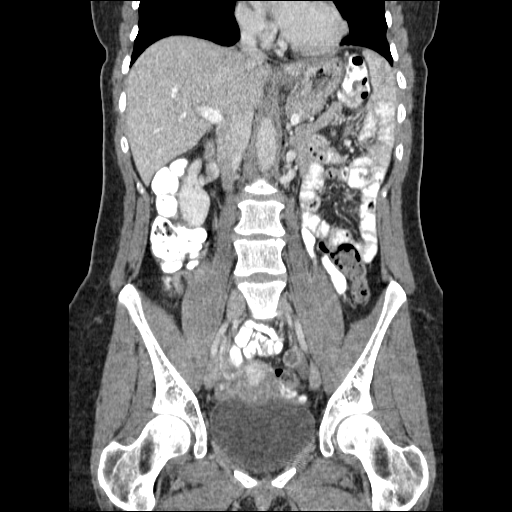
[im 56/101  soft-tissue]
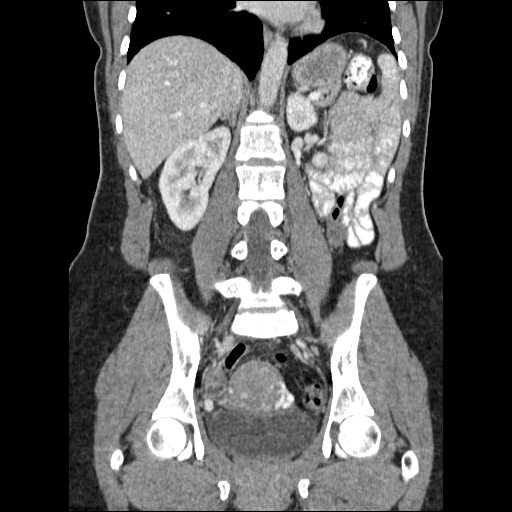

[17 of 46 positions shown; findings below may reference images not displayed]

FINDINGS: Visualized lung bases appear normal. No significant osseous
abnormality is noted.

Status post cholecystectomy. No focal abnormality is noted in the
liver, spleen or pancreas. Adrenal glands appear normal. No
hydronephrosis or renal obstruction is noted. Small nonobstructive
calculus is noted in lower pole collecting system of right kidney.
The appendix appears normal. There is no evidence of bowel
obstruction. Urinary bladder appears normal. No abnormal fluid
collection is noted 1.7 cm right ovarian cyst is noted. 1.8 cm left
ovarian cyst is noted. Probable 2.2 cm fibroid seen and uterine
fundus. Pelvic varices are noted, with left greater than right.
Dilated left ovarian vein is noted concerning for pelvic congestion
syndrome. No significant adenopathy is noted. Minimal free fluid is
noted in the posterior portion the pelvis which most likely is
physiologic.
IMPRESSION: 2.2 cm fibroid seen in the uterine fundus.

Small nonobstructive right renal calculus. No hydronephrosis or
renal obstruction is noted.

Pelvic varices are noted bilaterally, with left greater than right.
Dilated left ovarian vein is noted consistent with pelvic congestion
syndrome.

## 2014-09-29 ENCOUNTER — Ambulatory Visit (INDEPENDENT_AMBULATORY_CARE_PROVIDER_SITE_OTHER): Payer: BLUE CROSS/BLUE SHIELD | Admitting: Nurse Practitioner

## 2014-09-29 ENCOUNTER — Encounter: Payer: Self-pay | Admitting: Nurse Practitioner

## 2014-09-29 VITALS — BP 108/70 | Temp 98.4°F | Ht 64.25 in | Wt 141.2 lb

## 2014-09-29 DIAGNOSIS — R319 Hematuria, unspecified: Secondary | ICD-10-CM

## 2014-09-29 DIAGNOSIS — M5489 Other dorsalgia: Secondary | ICD-10-CM | POA: Diagnosis not present

## 2014-09-29 MED ORDER — FLUCONAZOLE 150 MG PO TABS: 150.0000 mg | ORAL_TABLET | Freq: Once | ORAL | Status: DC

## 2014-09-29 MED ORDER — CIPROFLOXACIN HCL 500 MG PO TABS: 500.0000 mg | ORAL_TABLET | Freq: Two times a day (BID) | ORAL | Status: DC

## 2014-09-29 NOTE — Patient Instructions (Signed)

## 2014-09-29 NOTE — Progress Notes (Signed)
S: 49 y.o.Divorced Caucasian female presents with complaint of UTI. Symptoms began on  Saturday am. With symptoms of back pain, blood in urine.  The patient is having constitutional symptoms, including malaise and slight nausea.  Not sexually active for over a month.  Symptoms not related to post coital.   Current method of birth control Sterilization by Laparoscopy.   No current partner. Last UTI documented  Several yrs ago.   She does have history of renal calculi and was told that she had 'gravel' in both kidneys.   Urel with some help.  ROS: no weight loss, fever, night sweats and feels well  O alert, oriented to person, place, and time   thin, healthy and  alert   ABDOMIN: non tender  No CVA tenderness  Pelvic; deferred   Diagnostic Test:    Urinalysis: 3 + RBC   Urine culture and micro  Assessment:  R/O UTI   Hematuria   History of renal calculi  Plan:   Maintain adequate hydration. Follow up if symptoms not improving, and as needed.   Medication Therapy: Cipro 500 mg BID # 13   Lab: Urine Culture & Micro    Diflucan 150 mg X 2 if needed   RV

## 2014-09-30 LAB — URINALYSIS, MICROSCOPIC ONLY
Bacteria, UA: NONE SEEN
Casts: NONE SEEN
Crystals: NONE SEEN
SQUAMOUS EPITHELIAL / LPF: NONE SEEN

## 2014-10-01 ENCOUNTER — Telehealth: Payer: Self-pay | Admitting: *Deleted

## 2014-10-01 LAB — URINE CULTURE

## 2014-10-01 NOTE — Telephone Encounter (Signed)
-----   Message from Regina Eck, CNM sent at 10/01/2014  8:42 AM EDT ----- Notify patient urine micro only showed very low count of RBCs, no bacteria Urine culture showed insignificant growth, complete medication as prescribed, advise if symptoms persist once completed

## 2014-10-02 NOTE — Telephone Encounter (Signed)
Pt notified in result note.  Closing encounter. 

## 2014-10-02 NOTE — Progress Notes (Signed)
Encounter reviewed by Dr. Devetta Hagenow Silva.  

## 2014-10-21 ENCOUNTER — Encounter: Payer: Self-pay | Admitting: Nurse Practitioner

## 2014-10-21 ENCOUNTER — Ambulatory Visit (INDEPENDENT_AMBULATORY_CARE_PROVIDER_SITE_OTHER): Payer: BLUE CROSS/BLUE SHIELD | Admitting: Nurse Practitioner

## 2014-10-21 VITALS — BP 120/76 | HR 80 | Ht 64.0 in | Wt 140.0 lb

## 2014-10-21 DIAGNOSIS — Z0000 Encounter for general adult medical examination without abnormal findings: Secondary | ICD-10-CM | POA: Diagnosis not present

## 2014-10-21 DIAGNOSIS — Z01419 Encounter for gynecological examination (general) (routine) without abnormal findings: Secondary | ICD-10-CM | POA: Diagnosis not present

## 2014-10-21 DIAGNOSIS — L723 Sebaceous cyst: Secondary | ICD-10-CM | POA: Diagnosis not present

## 2014-10-21 DIAGNOSIS — Z113 Encounter for screening for infections with a predominantly sexual mode of transmission: Secondary | ICD-10-CM | POA: Diagnosis not present

## 2014-10-21 LAB — POCT URINALYSIS DIPSTICK
BILIRUBIN UA: NEGATIVE
Glucose, UA: NEGATIVE
KETONES UA: NEGATIVE
Leukocytes, UA: NEGATIVE
NITRITE UA: NEGATIVE
PH UA: 7
PROTEIN UA: NEGATIVE
Urobilinogen, UA: NEGATIVE

## 2014-10-21 LAB — COMPREHENSIVE METABOLIC PANEL
ALBUMIN: 4 g/dL (ref 3.5–5.2)
ALT: 21 U/L (ref 0–35)
AST: 17 U/L (ref 0–37)
Alkaline Phosphatase: 32 U/L — ABNORMAL LOW (ref 39–117)
BILIRUBIN TOTAL: 2.2 mg/dL — AB (ref 0.2–1.2)
BUN: 14 mg/dL (ref 6–23)
CO2: 25 meq/L (ref 19–32)
Calcium: 9 mg/dL (ref 8.4–10.5)
Chloride: 104 mEq/L (ref 96–112)
Creat: 0.8 mg/dL (ref 0.50–1.10)
GLUCOSE: 92 mg/dL (ref 70–99)
Potassium: 4.7 mEq/L (ref 3.5–5.3)
SODIUM: 139 meq/L (ref 135–145)
TOTAL PROTEIN: 6.8 g/dL (ref 6.0–8.3)

## 2014-10-21 LAB — LIPID PANEL
Cholesterol: 132 mg/dL (ref 0–200)
HDL: 61 mg/dL (ref >=46)
LDL Cholesterol: 62 mg/dL (ref 0–99)
Total CHOL/HDL Ratio: 2.2 Ratio
Triglycerides: 46 mg/dL (ref <150)
VLDL: 9 mg/dL (ref 0–40)

## 2014-10-21 LAB — TSH: TSH: 2.021 u[IU]/mL (ref 0.350–4.500)

## 2014-10-21 NOTE — Progress Notes (Signed)
Patient ID: Meghan Frank, female   DOB: 12/07/1965, 49 y.o.   MRN: [id number] 49 y.o. [id number] Divorced  Caucasian Fe here for annual exam.  Still having some vaso symptoms. Same partner on/ off.  Was on progesterone bio identical HRT and did well. She has had problems with follow through with that clinic in [place] and being able to get her med's.  She also has a persistent cyst area at the left axilla that had I&D by PCP.  Th initial infection went away with antibiotics but the cyst remains.  Would like to see general surgeon for removal.  She just closed on a home last week and scheduled to move this weekend.  Patient's last menstrual period was 01/04/2009.          Sexually active: Yes.    The current method of family planning is ablation and tubal ligation.    Exercising: Yes.    Gym/ health club routine includes cardio and mod to heavy weightlifting. Smoker:  no  Health Maintenance: Pap:  08/11/11, negative with neg HR HPV MMG:  10/31/13, Bi-Rads 1:  Negative Colonoscopy:  2013 TDaP:  07/20/10 Labs:  HB:  12.5   Urine:  Small RBC   reports that she quit smoking about 3 years ago. Her smoking use included Cigarettes. She has never used smokeless tobacco. She reports that she drinks alcohol. She reports that she does not use illicit drugs.  Past Medical History  Diagnosis Date  . Recurrent herpes labialis   . Recurrent nephrolithiasis   . Complication of anesthesia     "doesn't wake up well"  . Anemia   . Gallstone   . PONV (postoperative nausea and vomiting)   . Internal/external hemorrhoids with bleeding 01/30/2012    Past Surgical History  Procedure Laterality Date  . Cholecystectomy  2008  . Tubal ligation  2000  . Laser ablation  2010    uterine ablation  . Lithotripsy  2008  . Mandible surgery  1985  . Cystoscopy/retrograde/ureteroscopy/stone extraction with basket  04/29/2011    Procedure: CYSTOSCOPY/RETROGRADE/URETEROSCOPY/STONE EXTRACTION WITH BASKET;  Surgeon: Ailene Rud, MD;  Location: [facility];  Service: Urology;  Laterality: Bilateral;  bilateral retrogrades, bilateral ureteroscopy, left stone basketry  . Mass excision  11/25/2011    right hand-neuroma removed  . Hemorrhoid surgery  2007  . Colonoscopy  01/30/2012    Procedure: COLONOSCOPY;  Surgeon: Gatha Mayer, MD;  Location: [facility];  Service: Endoscopy;  Laterality: N/A;  . Hand surgery  2013    Neuroma    Current Outpatient Prescriptions  Medication Sig Dispense Refill  . Multiple Vitamin (MULTIVITAMIN) tablet Take 1 tablet by mouth daily.    . valACYclovir (VALTREX) 1000 MG tablet For outbreak, take 1 tablet twice daily for 3 days, then 1 tablet once daily until fever blister is gone 30 tablet 12   No current facility-administered medications for this visit.    Family History  Problem Relation Age of Onset  . Coronary artery disease Father   . Diabetes Father   . Hyperlipidemia Father   . Coronary artery disease Paternal Grandmother   . Coronary artery disease Maternal Grandmother   . Colon cancer Maternal Grandfather   . Colon polyps Father   . Diverticulosis Father   . Lung cancer Paternal Grandfather     ROS:  Pertinent items are noted in HPI.  Otherwise, a comprehensive ROS was negative.  Exam:   BP 120/76 mmHg  Pulse 80  Ht 5\' 4"  (1.626 m)  Wt 140 lb (63.504 kg)  BMI 24.02 kg/m2  LMP 01/04/2009 Height: 5\' 4"  (162.6 cm) Ht Readings from Last 3 Encounters:  10/21/14 5\' 4"  (1.626 m)  09/29/14 5' 4.25" (1.632 m)  04/24/14 5' 4.25" (1.632 m)    General appearance: alert, cooperative and appears stated age Head: Normocephalic, without obvious abnormality, atraumatic Neck: no adenopathy, supple, symmetrical, trachea midline and thyroid normal to inspection and palpation Lungs: clear to auscultation bilaterally Breasts: normal appearance, no masses or tenderness Heart: regular rate and rhythm Abdomen: soft, non-tender; no masses,  no  organomegaly Extremities: extremities normal, atraumatic, no cyanosis or edema Skin: Skin color, texture, turgor normal. No rashes or lesions Lymph nodes: Cervical, supraclavicular, and axillary nodes normal - cyst present left axilla 1 cm without inflammation. No abnormal inguinal nodes palpated Neurologic: Grossly normal   Pelvic: External genitalia:  no lesions              Urethra:  normal appearing urethra with no masses, tenderness or lesions              Bartholin's and Skene's: normal                 Vagina: normal appearing vagina with normal color and discharge, no lesions              Cervix: anteverted              Pap taken: Yes.   Bimanual Exam:  Uterus:  normal size, contour, position, consistency, mobility, non-tender              Adnexa: no mass, fullness, tenderness               Rectovaginal: Confirms               Anus:  normal sphincter tone, no lesions  Chaperone present: yes  A:  Well Woman with normal exam  S/P BTL 2000 S/P HTA 2010 with amenorrhea  Recent UTI with insignificant growth on culture, current small RBC on chemstrip  R/O STD's  Cyst left axilla  P:   Reviewed health and wellness pertinent to exam  Pap smear as above  Mammogram is now and will schedule  Discussion about bio identical HRT - she may decide to still go back to [place] for progesterone replacement.  The labs done there only show a deficiency in progesterone.  Will follow with labs, [place], urine culture  Counseled on breast self exam, mammography screening, use and side effects of HRT, adequate intake of calcium and vitamin D, diet and exercise return annually or prn  An After Visit Summary was printed and given to the patient.

## 2014-10-21 NOTE — Patient Instructions (Signed)

## 2014-10-22 LAB — FOLLICLE STIMULATING HORMONE: FSH: 13.1 m[IU]/mL

## 2014-10-22 LAB — STD PANEL
HEP B S AG: NEGATIVE
HIV: NONREACTIVE

## 2014-10-22 LAB — HEMOGLOBIN, FINGERSTICK: Hemoglobin, fingerstick: 12.5 g/dL (ref 12.0–16.0)

## 2014-10-23 ENCOUNTER — Other Ambulatory Visit: Payer: Self-pay | Admitting: Nurse Practitioner

## 2014-10-23 DIAGNOSIS — R899 Unspecified abnormal finding in specimens from other organs, systems and tissues: Secondary | ICD-10-CM

## 2014-10-23 LAB — IPS N GONORRHOEA AND CHLAMYDIA BY PCR

## 2014-10-23 LAB — URINE CULTURE
Colony Count: NO GROWTH
ORGANISM ID, BACTERIA: NO GROWTH

## 2014-10-24 LAB — IPS PAP TEST WITH HPV

## 2014-10-26 NOTE — Progress Notes (Signed)
Encounter reviewed by Dr. Faith Patricelli Silva.  

## 2014-10-28 ENCOUNTER — Encounter: Payer: Self-pay | Admitting: Nurse Practitioner

## 2014-10-29 ENCOUNTER — Telehealth: Payer: Self-pay | Admitting: *Deleted

## 2014-10-29 NOTE — Telephone Encounter (Signed)
I have attempted to contact this patient by phone with the following results: left message to return call to [name] at [phone] on answering machine (mobile).  Pt name verified in voicemail.  Advised call was regarding recent labs and of need to repeat liver function tests.  Pt to ask to speak with me or with front office staff to schedule lab appt.

## 2014-10-29 NOTE — Telephone Encounter (Signed)
-----   Message from Kem Boroughs, [place] sent at 10/23/2014  8:13 AM EDT ----- Results via my chart:  Order is placed for labs.  Lilia, the STD's show a negative HIV, Hep B, syphilis.  The GC and Chlamydia is not yet back.  The urine culture was negative.  The [facility] was 13.1 - still very normal range and not menopausal yet.  The TSH - thyroid is also normal.  The lipid panel shows a normal cholesterol profile.  The CMP show a normal kidney function but slight elevated 2 parts of the liver test.  This should be repeated in about 2 weeks.  You will need to call and schedule that apt. - does not need to be fasting.  In the interim avoid anti-inflammatories such as Advil, Motrin, etc.  Also avoid alcohol until labs are rechecked.  I do hope you get moved safely and with no rain problems this weekend!

## 2014-10-29 NOTE — Telephone Encounter (Signed)
Questions answered regarding labs.  Pt has lab appt 6/1 for repeat LFT's.

## 2014-11-05 ENCOUNTER — Other Ambulatory Visit (INDEPENDENT_AMBULATORY_CARE_PROVIDER_SITE_OTHER): Payer: BLUE CROSS/BLUE SHIELD

## 2014-11-05 DIAGNOSIS — R899 Unspecified abnormal finding in specimens from other organs, systems and tissues: Secondary | ICD-10-CM

## 2014-11-05 LAB — HEPATIC FUNCTION PANEL
ALK PHOS: 31 U/L — AB (ref 39–117)
ALT: 12 U/L (ref 0–35)
AST: 14 U/L (ref 0–37)
Albumin: 4.1 g/dL (ref 3.5–5.2)
BILIRUBIN TOTAL: 0.9 mg/dL (ref 0.2–1.2)
Bilirubin, Direct: 0.2 mg/dL (ref 0.0–0.3)
Indirect Bilirubin: 0.7 mg/dL (ref 0.2–1.2)
TOTAL PROTEIN: 6.8 g/dL (ref 6.0–8.3)

## 2014-12-17 ENCOUNTER — Other Ambulatory Visit: Payer: Self-pay | Admitting: General Surgery

## 2014-12-19 ENCOUNTER — Other Ambulatory Visit: Payer: Self-pay

## 2014-12-19 DIAGNOSIS — Z1231 Encounter for screening mammogram for malignant neoplasm of breast: Secondary | ICD-10-CM

## 2014-12-24 ENCOUNTER — Ambulatory Visit
Admission: RE | Admit: 2014-12-24 | Discharge: 2014-12-24 | Disposition: A | Discharge status: Home / Self Care | Payer: BLUE CROSS/BLUE SHIELD | Source: Ambulatory Visit

## 2014-12-24 DIAGNOSIS — Z1231 Encounter for screening mammogram for malignant neoplasm of breast: Secondary | ICD-10-CM

## 2015-01-07 ENCOUNTER — Ambulatory Visit (INDEPENDENT_AMBULATORY_CARE_PROVIDER_SITE_OTHER): Payer: BLUE CROSS/BLUE SHIELD | Admitting: Adult Health

## 2015-01-07 ENCOUNTER — Encounter: Payer: Self-pay | Admitting: Adult Health

## 2015-01-07 VITALS — BP 104/70 | Temp 98.5°F | Ht 64.0 in | Wt 146.9 lb

## 2015-01-07 DIAGNOSIS — R300 Dysuria: Secondary | ICD-10-CM

## 2015-01-07 DIAGNOSIS — N3289 Other specified disorders of bladder: Secondary | ICD-10-CM

## 2015-01-07 LAB — POCT URINALYSIS DIPSTICK
BILIRUBIN UA: NEGATIVE
Blood, UA: NEGATIVE
Glucose, UA: NEGATIVE
Ketones, UA: NEGATIVE
LEUKOCYTES UA: NEGATIVE
Nitrite, UA: NEGATIVE
Protein, UA: NEGATIVE
Spec Grav, UA: 1.025
Urobilinogen, UA: 0.2
pH, UA: 6

## 2015-01-07 NOTE — Progress Notes (Signed)
Subjective:    Patient ID: Meghan Frank, female    DOB: December 23, 1965, 49 y.o.   MRN: [id number]  HPI  49 year old female with history of kidney stones who presents to the office today for bladder spasm/urgency/dribbling since Sunday. She was awoken from sleep with bladder spasms around 3 am.  Describes the pain as "pressure" when she has to urinate and with her bladder spasms. She is taking Urelle for her spasms. She endorses decreased pain and pressure since Sunday. Currently at a 3/10.   Does have a history of kidney stones   Review of Systems  Constitutional: Negative.  Negative for fever, chills and fatigue.  Gastrointestinal: Negative.   Genitourinary: Positive for urgency, frequency and difficulty urinating.  Musculoskeletal: Negative.   All other systems reviewed and are negative.  Past Medical History  Diagnosis Date  . Recurrent herpes labialis   . Recurrent nephrolithiasis   . Complication of anesthesia     "doesn't wake up well"  . Anemia   . Gallstone   . PONV (postoperative nausea and vomiting)   . Internal/external hemorrhoids with bleeding 01/30/2012    History   Social History  . Marital Status: Divorced    Spouse Name: N/A  . Number of Children: N/A  . Years of Education: N/A   Occupational History  . Not on file.   Social History Main Topics  . Smoking status: Former Smoker    Types: Cigarettes    Quit date: 04/26/2011  . Smokeless tobacco: Never Used  . Alcohol Use: Yes     Comment: occasional- couple times a year  . Drug Use: No  . Sexual Activity: Yes    Birth Control/ Protection: Surgical   Other Topics Concern  . Not on file   Social History Narrative   Former smoker   [place] of 2   Pet dog and GP and [employer]   Works [employer] lives out town in [place]   Daily caffeine    Past Surgical History  Procedure Laterality Date  . Cholecystectomy  2008  . Tubal ligation  2000  . Laser ablation  2010    uterine ablation  . Lithotripsy   2008  . Mandible surgery  1985  . Cystoscopy/retrograde/ureteroscopy/stone extraction with basket  04/29/2011    Procedure: CYSTOSCOPY/RETROGRADE/URETEROSCOPY/STONE EXTRACTION WITH BASKET;  Surgeon: Ailene Rud, MD;  Location: [facility];  Service: Urology;  Laterality: Bilateral;  bilateral retrogrades, bilateral ureteroscopy, left stone basketry  . Mass excision  11/25/2011    right hand-neuroma removed  . Hemorrhoid surgery  2007  . Colonoscopy  01/30/2012    Procedure: COLONOSCOPY;  Surgeon: Gatha Mayer, MD;  Location: [facility];  Service: Endoscopy;  Laterality: N/A;  . Hand surgery  2013    Neuroma    Family History  Problem Relation Age of Onset  . Coronary artery disease Father   . Diabetes Father   . Hyperlipidemia Father   . Coronary artery disease Paternal Grandmother   . Coronary artery disease Maternal Grandmother   . Colon cancer Maternal Grandfather   . Colon polyps Father   . Diverticulosis Father   . Lung cancer Paternal Grandfather     Allergies  Allergen Reactions  . Bee Venom Other (See Comments)    Patient receives antibiotic cream for infection due to sting  . Sulfonamide Derivatives Rash    Current Outpatient Prescriptions on File Prior to Visit  Medication Sig Dispense Refill  . Multiple Vitamin (  MULTIVITAMIN) tablet Take 1 tablet by mouth daily.    . valACYclovir (VALTREX) 1000 MG tablet For outbreak, take 1 tablet twice daily for 3 days, then 1 tablet once daily until fever blister is gone 30 tablet 12   No current facility-administered medications on file prior to visit.    BP 104/70 mmHg  Temp(Src) 98.5 F (36.9 C) (Oral)  Ht 5\' 4"  (1.626 m)  Wt 146 lb 14.4 oz (66.633 kg)  BMI 25.20 kg/m2       Objective:   Physical Exam  Constitutional: She is oriented to person, place, and time. She appears well-developed and well-nourished. No distress.  Cardiovascular: Normal rate, regular rhythm, normal heart sounds and intact distal  pulses.  Exam reveals no gallop and no friction rub.   No murmur heard. Pulmonary/Chest: Effort normal and breath sounds normal. No respiratory distress. She has no wheezes. She has no rales. She exhibits no tenderness.  Abdominal: Soft. Bowel sounds are normal. She exhibits no distension and no mass. There is no tenderness. There is no rebound and no guarding.  Neurological: She is alert and oriented to person, place, and time.  Skin: Skin is warm and dry. No rash noted. She is not diaphoretic. No erythema. No pallor.  Psychiatric: She has a normal mood and affect. Her behavior is normal. Judgment and thought content normal.  Vitals reviewed.      Assessment & Plan:  1. Dysuria - Urinalysis came back clean.  - POCT urinalysis dipstick; Standing - Culture, Urine - POCT urinalysis dipstick  2. Bladder spasm - UTI vs Kidney Stone - Has prescriptions for Rapiflow and Eurelle at home. She will take these to see if it helps with her spasms. Also advised to take OTC AZO.  - Recommended KUB but patient kindly refused at this time.  - Follow up if no improvement in the next 2-3 days.

## 2015-01-07 NOTE — Patient Instructions (Signed)
It was a pleasure meeting you!  Try taking your Rapiflow and Urelle.   Follow up with me if no improvement in the next 2-3 days.   Have a safe trip.

## 2015-01-07 NOTE — Progress Notes (Signed)
Pre visit review using our clinic review tool, if applicable. No additional management support is needed unless otherwise documented below in the visit note. 

## 2015-01-09 ENCOUNTER — Encounter: Payer: Self-pay | Admitting: Adult Health

## 2015-01-09 LAB — URINE CULTURE
Colony Count: NO GROWTH
Organism ID, Bacteria: NO GROWTH

## 2015-03-31 ENCOUNTER — Telehealth: Payer: Self-pay | Admitting: Nurse Practitioner

## 2015-03-31 NOTE — Telephone Encounter (Signed)
Medication refill request: Valtrex  Last AEX:  10-21-14  Next AEX: 10-23-15 Last MMG (if hormonal medication request): 12-26-14 WNL Refill authorized: please advise

## 2015-03-31 NOTE — Telephone Encounter (Signed)
Patient would like a refill for valtrex sent to [facility] on [address] at [phone].

## 2015-04-01 MED ORDER — VALACYCLOVIR HCL 1 G PO TABS: ORAL_TABLET | ORAL | Status: DC

## 2015-04-01 NOTE — Telephone Encounter (Signed)
Order signed.  Thanks.  OK to close encounter.

## 2015-07-24 ENCOUNTER — Ambulatory Visit (INDEPENDENT_AMBULATORY_CARE_PROVIDER_SITE_OTHER): Payer: 59 | Admitting: Family Medicine

## 2015-07-24 VITALS — BP 128/82 | HR 123 | Temp 98.8°F | Ht 64.0 in | Wt 151.2 lb

## 2015-07-24 DIAGNOSIS — J069 Acute upper respiratory infection, unspecified: Secondary | ICD-10-CM

## 2015-07-24 DIAGNOSIS — B9789 Other viral agents as the cause of diseases classified elsewhere: Principal | ICD-10-CM

## 2015-07-24 NOTE — Patient Instructions (Signed)
Viral Infections °A viral infection can be caused by different types of viruses. Most viral infections are not serious and resolve on their own. However, some infections may cause severe symptoms and may lead to further complications. °SYMPTOMS °Viruses can frequently cause: °· Minor sore throat. °· Aches and pains. °· Headaches. °· Runny nose. °· Different types of rashes. °· Watery eyes. °· Tiredness. °· Cough. °· Loss of appetite. °· Gastrointestinal infections, resulting in nausea, vomiting, and diarrhea. °These symptoms do not respond to antibiotics because the infection is not caused by bacteria. However, you might catch a bacterial infection following the viral infection. This is sometimes called a "superinfection." Symptoms of such a bacterial infection may include: °· Worsening sore throat with pus and difficulty swallowing. °· Swollen neck glands. °· Chills and a high or persistent fever. °· Severe headache. °· Tenderness over the sinuses. °· Persistent overall ill feeling (malaise), muscle aches, and tiredness (fatigue). °· Persistent cough. °· Yellow, green, or brown mucus production with coughing. °HOME CARE INSTRUCTIONS  °· Only take over-the-counter or prescription medicines for pain, discomfort, diarrhea, or fever as directed by your caregiver. °· Drink enough water and fluids to keep your urine clear or pale yellow. Sports drinks can provide valuable electrolytes, sugars, and hydration. °· Get plenty of rest and maintain proper nutrition. Soups and broths with crackers or rice are fine. °SEEK IMMEDIATE MEDICAL CARE IF:  °· You have severe headaches, shortness of breath, chest pain, neck pain, or an unusual rash. °· You have uncontrolled vomiting, diarrhea, or you are unable to keep down fluids. °· You or your child has an oral temperature above 102° F (38.9° C), not controlled by medicine. °· Your baby is older than 3 months with a rectal temperature of 102° F (38.9° C) or higher. °· Your baby is 3  months old or younger with a rectal temperature of 100.4° F (38° C) or higher. °MAKE SURE YOU:  °· Understand these instructions. °· Will watch your condition. °· Will get help right away if you are not doing well or get worse. °  °This information is not intended to replace advice given to you by your health care provider. Make sure you discuss any questions you have with your health care provider. °  °Document Released: 03/02/2005 Document Revised: 08/15/2011 Document Reviewed: 10/29/2014 °Elsevier Interactive Patient Education ©2016 [employer] Inc[employer] ° °

## 2015-07-24 NOTE — Progress Notes (Signed)
   Subjective:    Patient ID: Meghan Frank, female    DOB: January 21, 1966, 50 y.o.   MRN: [id number]  HPI Acute visit. Onset 3 days ago fatigue, cough, nasal congestion and mild headaches Denies any fever. No body aches. One episode of nausea without vomiting. No diarrhea. No known sick contacts. Denies sore throat. Took some Mucinex with phenyl Afrin last night but felt "jittery".  Past Medical History  Diagnosis Date  . Recurrent herpes labialis   . Recurrent nephrolithiasis   . Complication of anesthesia     "doesn't wake up well"  . Anemia   . Gallstone   . PONV (postoperative nausea and vomiting)   . Internal/external hemorrhoids with bleeding 01/30/2012   Past Surgical History  Procedure Laterality Date  . Cholecystectomy  2008  . Tubal ligation  2000  . Laser ablation  2010    uterine ablation  . Lithotripsy  2008  . Mandible surgery  1985  . Cystoscopy/retrograde/ureteroscopy/stone extraction with basket  04/29/2011    Procedure: CYSTOSCOPY/RETROGRADE/URETEROSCOPY/STONE EXTRACTION WITH BASKET;  Surgeon: Ailene Rud, MD;  Location: [facility];  Service: Urology;  Laterality: Bilateral;  bilateral retrogrades, bilateral ureteroscopy, left stone basketry  . Mass excision  11/25/2011    right hand-neuroma removed  . Hemorrhoid surgery  2007  . Colonoscopy  01/30/2012    Procedure: COLONOSCOPY;  Surgeon: Gatha Mayer, MD;  Location: [facility];  Service: Endoscopy;  Laterality: N/A;  . Hand surgery  2013    Neuroma    reports that she quit smoking about 4 years ago. Her smoking use included Cigarettes. She has never used smokeless tobacco. She reports that she drinks alcohol. She reports that she does not use illicit drugs. family history includes Colon cancer in her maternal grandfather; Colon polyps in her father; Coronary artery disease in her father, maternal grandmother, and paternal grandmother; Diabetes in her father; Diverticulosis in her father;  Hyperlipidemia in her father; Lung cancer in her paternal grandfather. Allergies  Allergen Reactions  . Bee Venom Other (See Comments)    Patient receives antibiotic cream for infection due to sting  . Sulfonamide Derivatives Rash      Review of Systems  Constitutional: Positive for fatigue. Negative for fever and chills.  HENT: Positive for congestion. Negative for sore throat.   Respiratory: Positive for cough.   Neurological: Positive for headaches.       Objective:   Physical Exam  Constitutional: She appears well-developed and well-nourished.  HENT:  Right Ear: External ear normal.  Left Ear: External ear normal.  Mouth/Throat: Oropharynx is clear and moist.  Neck: Neck supple.  Cardiovascular: Normal rate and regular rhythm.   Pulmonary/Chest: Effort normal and breath sounds normal. No respiratory distress. She has no wheezes. She has no rales.  Lymphadenopathy:    She has no cervical adenopathy.          Assessment & Plan:  Viral URI with cough. Recommend plain Mucinex. Over-the-counter cough medications as needed. Stay well-hydrated. Follow-up as needed

## 2015-10-23 ENCOUNTER — Encounter: Payer: Self-pay | Admitting: *Deleted

## 2015-10-23 ENCOUNTER — Ambulatory Visit (INDEPENDENT_AMBULATORY_CARE_PROVIDER_SITE_OTHER): Payer: 59 | Admitting: Nurse Practitioner

## 2015-10-23 VITALS — BP 110/74 | HR 68

## 2015-10-23 DIAGNOSIS — Z0000 Encounter for general adult medical examination without abnormal findings: Secondary | ICD-10-CM

## 2015-10-23 DIAGNOSIS — N951 Menopausal and female climacteric states: Secondary | ICD-10-CM

## 2015-10-23 DIAGNOSIS — Z01419 Encounter for gynecological examination (general) (routine) without abnormal findings: Secondary | ICD-10-CM | POA: Diagnosis not present

## 2015-10-23 LAB — POCT URINALYSIS DIPSTICK
Bilirubin, UA: NEGATIVE
GLUCOSE UA: NEGATIVE
Ketones, UA: NEGATIVE
LEUKOCYTES UA: NEGATIVE
NITRITE UA: NEGATIVE
Protein, UA: NEGATIVE
RBC UA: NEGATIVE
UROBILINOGEN UA: NEGATIVE
pH, UA: 6.5

## 2015-10-23 MED ORDER — ALPRAZOLAM 0.5 MG PO TABS: 0.5000 mg | ORAL_TABLET | Freq: Every evening | ORAL | Status: DC | PRN

## 2015-10-23 NOTE — Progress Notes (Signed)
Patient ID: Meghan Frank, female   DOB: [birth year], 50 y.o.   MRN: [id number]  50 y.o. G2P2002 Divorced  Caucasian Fe here for annual exam.  Same partner for 10 yrs. Amenorrhea since HTA.  She feels that she is having 'hormonal' changes. She is having some problems with memory, particularly numbers and names.  She can recall them later.  Past and present dates and events without a memory deficient.  She has also noted being more anxious than usual.  Does not relate this to caffeine intake, stress, or situational events.  When they occur can last as much as 20 minutes.  Most of time can do bio - feedback and calm herself down.  She asked for something to help on those rare times that is getting the best of her.  Last year [facility] was 13.1.  Patient's last menstrual period was 01/04/2009.         Pt had ablation Sexually active: Yes.    The current method of family planning is ablation and tubal ligation.    Exercising: No.  The patient does not participate in regular exercise at present. Smoker:  Former smoker  Health Maintenance: Pap:10/21/14, Negative with neg HR HPV MMG:12/24/14, Bi-Rads 1: Negative Colonoscopy: 2013 TDaP: 07/20/10 HIV: 10/21/14 Shingles: Not indicated due to age Pneumonia: Not indicated due to age Labs: HB: 12.6  Urine: Negative    reports that she quit smoking about 4 years ago. Her smoking use included Cigarettes. She has never used smokeless tobacco. She reports that she drinks alcohol. She reports that she does not use illicit drugs.  Past Medical History  Diagnosis Date  . Recurrent herpes labialis   . Recurrent nephrolithiasis   . Complication of anesthesia     "doesn't wake up well"  . Anemia   . Gallstone   . PONV (postoperative nausea and vomiting)   . Internal/external hemorrhoids with bleeding 01/30/2012    Past Surgical History  Procedure Laterality Date  . Cholecystectomy  2008  . Tubal ligation  2000  . Laser ablation  2010    uterine ablation  .  Lithotripsy  2008  . Mandible surgery  1985  . Cystoscopy/retrograde/ureteroscopy/stone extraction with basket  04/29/2011    Procedure: CYSTOSCOPY/RETROGRADE/URETEROSCOPY/STONE EXTRACTION WITH BASKET;  Surgeon: Ailene Rud, MD;  Location: [facility];  Service: Urology;  Laterality: Bilateral;  bilateral retrogrades, bilateral ureteroscopy, left stone basketry  . Mass excision  11/25/2011    right hand-neuroma removed  . Hemorrhoid surgery  2007  . Colonoscopy  01/30/2012    Procedure: COLONOSCOPY;  Surgeon: Gatha Mayer, MD;  Location: [facility];  Service: Endoscopy;  Laterality: N/A;    Current Outpatient Prescriptions  Medication Sig Dispense Refill  . Multiple Vitamin (MULTIVITAMIN) tablet Take 1 tablet by mouth daily.    . valACYclovir (VALTREX) 1000 MG tablet For outbreak, take 1 tablet twice daily for 3 days, then 1 tablet once daily until fever blister is gone 30 tablet 12  . ALPRAZolam (XANAX) 0.5 MG tablet Take 1 tablet (0.5 mg total) by mouth at bedtime as needed for anxiety. 30 tablet 0   No current facility-administered medications for this visit.    Family History  Problem Relation Age of Onset  . Coronary artery disease Father   . Diabetes Father   . Hyperlipidemia Father   . Colon polyps Father   . Diverticulosis Father   . Coronary artery disease Paternal Grandmother   . Coronary artery disease Maternal Grandmother   .  Colon cancer Maternal Grandfather   . Lung cancer Paternal Grandfather   . Hypertension Sister     ROS:  Pertinent items are noted in HPI.  Otherwise, a comprehensive ROS was negative.  Exam:   BP 110/74 mmHg  Pulse 68  LMP 01/04/2009   Ht Readings from Last 3 Encounters:  07/24/15 5\' 4"  (1.626 m)  01/07/15 5\' 4"  (1.626 m)  10/21/14 5\' 4"  (1.626 m)    General appearance: alert, cooperative and appears stated age Head: Normocephalic, without obvious abnormality, atraumatic Neck: no adenopathy, supple, symmetrical, trachea midline  and thyroid normal to inspection and palpation Lungs: clear to auscultation bilaterally Breasts: normal appearance, no masses or tenderness Heart: regular rate and rhythm Abdomen: soft, non-tender; no masses,  no organomegaly Extremities: extremities normal, atraumatic, no cyanosis or edema Skin: Skin color, texture, turgor normal. No rashes or lesions Lymph nodes: Cervical, supraclavicular, and axillary nodes normal. No abnormal inguinal nodes palpated Neurologic: Grossly normal   Pelvic: External genitalia:  no lesions              Urethra:  normal appearing urethra with no masses, tenderness or lesions              Bartholin's and Skene's: normal                 Vagina: normal appearing vagina with normal color and discharge, no lesions              Cervix: anteverted              Pap taken: No. Bimanual Exam:  Uterus:  normal size, contour, position, consistency, mobility, non-tender              Adnexa: no mass, fullness, tenderness               Rectovaginal: Confirms               Anus:  normal sphincter tone, no lesions  Chaperone present: no  A:  Well Woman with normal exam  S/P BTL 2000 S/P HTA 2010 with amenorrhea  Recent episodic anxiety attacks    P:   Reviewed health and wellness pertinent to exam  Pap smear as above  Mammogram is due 12/2015  She is given RX of Xanax 0.5 mg to take 1/2 - 1 tablet prn only # 30/0  She is aware that if she needed more medication for this it would have to come through PCP - Dr. Regis Bill.  She states her use would be rare. She is given potential SE of dizzy and drowsy and avoid driving or machinery while on medication.  Will also recheck another [facility] and compare from last year.   Counseled on breast self exam, mammography screening, adequate intake of calcium and vitamin D, diet and exercise return annually or prn  An After Visit Summary was printed and given to the patient.

## 2015-10-23 NOTE — Patient Instructions (Signed)

## 2015-10-24 LAB — FOLLICLE STIMULATING HORMONE: FSH: 3.3 m[IU]/mL

## 2015-10-25 NOTE — Progress Notes (Signed)
Encounter reviewed by Dr. Brook Amundson C. Silva.  

## 2015-10-29 LAB — HEMOGLOBIN, FINGERSTICK: Hemoglobin, fingerstick: 12.6 g/dL (ref 12.0–16.0)

## 2016-02-23 ENCOUNTER — Other Ambulatory Visit: Payer: Self-pay | Admitting: Nurse Practitioner

## 2016-02-23 ENCOUNTER — Other Ambulatory Visit: Payer: Self-pay | Admitting: Internal Medicine

## 2016-02-23 DIAGNOSIS — Z1231 Encounter for screening mammogram for malignant neoplasm of breast: Secondary | ICD-10-CM

## 2016-02-26 ENCOUNTER — Ambulatory Visit (INDEPENDENT_AMBULATORY_CARE_PROVIDER_SITE_OTHER): Payer: 59 | Admitting: Obstetrics and Gynecology

## 2016-02-26 ENCOUNTER — Encounter: Payer: Self-pay | Admitting: Obstetrics and Gynecology

## 2016-02-26 VITALS — BP 120/78 | HR 76 | Resp 14 | Ht 64.0 in | Wt 145.0 lb

## 2016-02-26 DIAGNOSIS — N3090 Cystitis, unspecified without hematuria: Secondary | ICD-10-CM

## 2016-02-26 DIAGNOSIS — R3915 Urgency of urination: Secondary | ICD-10-CM | POA: Diagnosis not present

## 2016-02-26 DIAGNOSIS — L293 Anogenital pruritus, unspecified: Secondary | ICD-10-CM | POA: Diagnosis not present

## 2016-02-26 DIAGNOSIS — Z113 Encounter for screening for infections with a predominantly sexual mode of transmission: Secondary | ICD-10-CM | POA: Diagnosis not present

## 2016-02-26 DIAGNOSIS — N762 Acute vulvitis: Secondary | ICD-10-CM | POA: Diagnosis not present

## 2016-02-26 LAB — POCT URINALYSIS DIPSTICK
Glucose, UA: NEGATIVE
KETONES UA: NEGATIVE
NITRITE UA: POSITIVE
PH UA: 7
UROBILINOGEN UA: NEGATIVE

## 2016-02-26 MED ORDER — NITROFURANTOIN MONOHYD MACRO 100 MG PO CAPS: 100.0000 mg | ORAL_CAPSULE | Freq: Two times a day (BID) | ORAL | 0 refills | Status: DC

## 2016-02-26 NOTE — Progress Notes (Signed)
pohyjGYNECOLOGY  VISIT   HPI: 50 y.o.   Divorced  Caucasian  female   G2P2002 with No LMP recorded. Patient has had an ablation.   here for vaginal itching. Pt c/o urinary urgency with "foul" odor with frequency, and mild dysuria for the last 3-4 days. Voiding small to normal amounts. She has a h/o kidney stones, has noticed slight discomfort in her kidneys, stopped with increased hydration. No fevers. She c/o vulvar itching for the last few days. No d/c, no odor from d/c. Patient states has had some unprotected IC recently.      GYNECOLOGIC HISTORY: No LMP recorded. Patient has had an ablation. Contraception: BTL & Ablation Menopausal hormone therapy: none        OB History    Gravida Para Term Preterm AB Living   2 2 2  0 0 2   SAB TAB Ectopic Multiple Live Births   0 0 0 0 2         Patient Active Problem List   Diagnosis Date Noted  . Hx of staphylococcal infection 04/24/2014  . Lump of axilla 04/24/2014  . Staphylococcus aureus infection 03/14/2014  . Abscess of axillary region 03/12/2014  . Cellulitis of axilla, left 03/12/2014  . Abdominal pain, acute 12/27/2013  . Nausea alone 12/27/2013  . Hx of urinary stone 12/27/2013  . Internal/external hemorrhoids with bleeding 01/30/2012  . Rectal bleeding 01/13/2012  . Neuroma of hand 12/01/2011  . Sleep disorder 08/25/2011  . Fatigue 08/25/2011  . Moodiness 08/25/2011  . UNSPEC LOCAL INFECTION SKIN&SUBCUTANEOUS TISSUE 04/27/2010  . NEPHROLITHIASIS, HX OF 12/24/2008  . HERPES SIMPLEX INFECTION, RECURRENT 01/29/2007  . HEMATURIA 01/29/2007  . ABDOMINAL PAIN 01/29/2007    Past Medical History:  Diagnosis Date  . Anemia   . Complication of anesthesia    "doesn't wake up well"  . Gallstone   . Internal/external hemorrhoids with bleeding 01/30/2012  . PONV (postoperative nausea and vomiting)   . Recurrent herpes labialis   . Recurrent nephrolithiasis     Past Surgical History:  Procedure Laterality Date  .  CHOLECYSTECTOMY  2008  . COLONOSCOPY  01/30/2012   Procedure: COLONOSCOPY;  Surgeon: Gatha Mayer, MD;  Location: [facility];  Service: Endoscopy;  Laterality: N/A;  . CYSTOSCOPY/RETROGRADE/URETEROSCOPY/STONE EXTRACTION WITH BASKET  04/29/2011   Procedure: CYSTOSCOPY/RETROGRADE/URETEROSCOPY/STONE EXTRACTION WITH BASKET;  Surgeon: Ailene Rud, MD;  Location: [facility];  Service: Urology;  Laterality: Bilateral;  bilateral retrogrades, bilateral ureteroscopy, left stone basketry  . HEMORRHOID SURGERY  2007  . LASER ABLATION  2010   uterine ablation  . LITHOTRIPSY  2008  . MANDIBLE SURGERY  1985  . MASS EXCISION  11/25/2011   right hand-neuroma removed  . TUBAL LIGATION  2000    Current Outpatient Prescriptions  Medication Sig Dispense Refill  . ALPRAZolam (XANAX) 0.5 MG tablet Take 1 tablet (0.5 mg total) by mouth at bedtime as needed for anxiety. 30 tablet 0  . Multiple Vitamin (MULTIVITAMIN) tablet Take 1 tablet by mouth daily.    . valACYclovir (VALTREX) 1000 MG tablet For outbreak, take 1 tablet twice daily for 3 days, then 1 tablet once daily until fever blister is gone 30 tablet 12   No current facility-administered medications for this visit.      ALLERGIES: Bee venom and Sulfonamide derivatives  Family History  Problem Relation Age of Onset  . Coronary artery disease Father   . Diabetes Father   . Hyperlipidemia Father   . Colon polyps Father   .  Diverticulosis Father   . Coronary artery disease Paternal Grandmother   . Coronary artery disease Maternal Grandmother   . Colon cancer Maternal Grandfather   . Lung cancer Paternal Grandfather   . Hypertension Sister     Social History   Social History  . Marital status: Divorced    Spouse name: N/A  . Number of children: N/A  . Years of education: N/A   Occupational History  . Not on file.   Social History Main Topics  . Smoking status: Former Smoker    Types: Cigarettes    Quit date: 04/26/2011  .  Smokeless tobacco: Never Used  . Alcohol use Yes     Comment: occasional- couple times a year  . Drug use: No  . Sexual activity: Yes    Birth control/ protection: Surgical   Other Topics Concern  . Not on file   Social History Narrative   Former smoker   [place] of [employer]   Pet dog and [employer] and [employer]   Works [place] lives out town in [place]   Daily caffeine    Review of Systems  Constitutional: Negative.   HENT: Negative.   Eyes: Negative.   Respiratory: Negative.   Cardiovascular: Negative.   Gastrointestinal: Negative.   Genitourinary:       Vaginal itching Urinary urgency Urinary odor   Musculoskeletal: Negative.   Skin: Negative.   Neurological: Negative.   Endo/Heme/Allergies: Negative.   Psychiatric/Behavioral: Negative.     PHYSICAL EXAMINATION:    BP 120/78 (BP Location: Right Arm, Patient Position: Sitting, Cuff Size: Normal)   Pulse 76   Resp 14   Ht 5\' 4"  (1.626 m)   Wt 145 lb (65.8 kg)   BMI 24.89 kg/m     General appearance: alert, cooperative and appears stated age Abdomen: soft, non-tender; no masses,  no organomegaly CVA: not tender  Pelvic: External genitalia:  no lesions, minimal erythema              Urethra:  normal appearing urethra with no masses, tenderness or lesions              Bartholins and Skenes: normal                 Vagina: normal appearing vagina with normal color and discharge, no lesions              Cervix: no lesions  Chaperone was present for exam.  Wet prep: ? clue, no trich, no wbc KOH: no yeast PH: 4-4.5   ASSESSMENT UTI Genital pruritus, indeterminate vaginal slides Screening for STD    PLAN Urine for ua, c&s STD testing Macrobid for UTI, declines pyridium (has some OTC medication) Send wet prep probe Declines a steroid ointment    An After Visit Summary was printed and given to the patient.

## 2016-02-26 NOTE — Patient Instructions (Signed)

## 2016-02-27 LAB — URINALYSIS, MICROSCOPIC ONLY
CRYSTALS: NONE SEEN [HPF]
Casts: NONE SEEN [LPF]
Yeast: NONE SEEN [HPF]

## 2016-02-27 LAB — STD PANEL
HEP B S AG: NEGATIVE
HIV: NONREACTIVE

## 2016-02-27 LAB — GC/CHLAMYDIA PROBE AMP
CT PROBE, AMP APTIMA: NOT DETECTED
GC Probe RNA: NOT DETECTED

## 2016-02-27 LAB — HEPATITIS C ANTIBODY: HCV Ab: NEGATIVE

## 2016-02-29 ENCOUNTER — Ambulatory Visit
Admission: RE | Admit: 2016-02-29 | Discharge: 2016-02-29 | Disposition: A | Discharge status: Home / Self Care | Payer: 59 | Source: Ambulatory Visit | Attending: Nurse Practitioner | Admitting: Nurse Practitioner

## 2016-02-29 ENCOUNTER — Telehealth: Payer: Self-pay | Admitting: *Deleted

## 2016-02-29 DIAGNOSIS — Z1231 Encounter for screening mammogram for malignant neoplasm of breast: Secondary | ICD-10-CM

## 2016-02-29 LAB — WET PREP BY MOLECULAR PROBE
CANDIDA SPECIES: NEGATIVE
Gardnerella vaginalis: POSITIVE — AB
TRICHOMONAS VAG: NEGATIVE

## 2016-02-29 LAB — URINE CULTURE

## 2016-02-29 MED ORDER — METRONIDAZOLE 500 MG PO TABS: 500.0000 mg | ORAL_TABLET | Freq: Two times a day (BID) | ORAL | 0 refills | Status: DC

## 2016-02-29 NOTE — Telephone Encounter (Signed)
-----   Message from Salvadore Dom, MD sent at 02/29/2016 11:01 AM EDT ----- Please inform the patient that her vaginitis probe was + for BV and treat with flagyl (either oral or vaginal, her choice), no ETOH while on Flagyl.  Oral: Flagyl 500 mg BID x 7 days, or Vaginal: Metrogel, 1 applicator per vagina q day x 5 days. Her UTI should be susceptible to the antibiotic she is on. Please confirm she is feeling better.

## 2016-02-29 NOTE — Telephone Encounter (Signed)
Spoke with patient and gave lab results. Patient voiced understanding- RX was sent in -eh

## 2016-04-16 ENCOUNTER — Other Ambulatory Visit: Payer: Self-pay | Admitting: Obstetrics & Gynecology

## 2016-04-18 NOTE — Telephone Encounter (Signed)
[facility] pharmacy called to notify our office that their location is closing down this coming Thursday. She believes they are still filling prescriptions Thursday but stated having patients prescription sent in to fill Wednesday would be the best.   Meghan Frank phone: [phone]

## 2016-04-18 NOTE — Telephone Encounter (Signed)
eScribe request from [facility] for refill on [facility] Last filled - 04/01/15, #30 X 12 Last AEX - 10/23/15 Next AEX - 10/24/16  Please advise refill.

## 2016-05-18 ENCOUNTER — Ambulatory Visit (INDEPENDENT_AMBULATORY_CARE_PROVIDER_SITE_OTHER): Payer: 59 | Admitting: Family Medicine

## 2016-05-18 VITALS — BP 116/74 | HR 78 | Temp 98.2°F | Ht 64.0 in | Wt 152.2 lb

## 2016-05-18 DIAGNOSIS — J029 Acute pharyngitis, unspecified: Secondary | ICD-10-CM | POA: Diagnosis not present

## 2016-05-18 DIAGNOSIS — H9202 Otalgia, left ear: Secondary | ICD-10-CM | POA: Diagnosis not present

## 2016-05-18 NOTE — Progress Notes (Signed)
Subjective:     Patient ID: Meghan Frank, female   DOB: 10-10-1965, 50 y.o.   MRN: [id number]  HPI Patient seen with left earache which started 2 days ago. She's had some nonspecific fatigue and mild sore throat and mild nasal congestion past few days. No chills. No fever. No sudden hearing loss. Denies any vertigo. She's had couple of recent sick contacts.  Her pain is relatively mild  Past Medical History:  Diagnosis Date  . Anemia   . Complication of anesthesia    "doesn't wake up well"  . Gallstone   . Internal/external hemorrhoids with bleeding 01/30/2012  . PONV (postoperative nausea and vomiting)   . Recurrent herpes labialis   . Recurrent nephrolithiasis    Past Surgical History:  Procedure Laterality Date  . CHOLECYSTECTOMY  2008  . COLONOSCOPY  01/30/2012   Procedure: COLONOSCOPY;  Surgeon: Gatha Mayer, MD;  Location: [facility];  Service: Endoscopy;  Laterality: N/A;  . CYSTOSCOPY/RETROGRADE/URETEROSCOPY/STONE EXTRACTION WITH BASKET  04/29/2011   Procedure: CYSTOSCOPY/RETROGRADE/URETEROSCOPY/STONE EXTRACTION WITH BASKET;  Surgeon: Ailene Rud, MD;  Location: [facility];  Service: Urology;  Laterality: Bilateral;  bilateral retrogrades, bilateral ureteroscopy, left stone basketry  . HEMORRHOID SURGERY  2007  . LASER ABLATION  2010   uterine ablation  . LITHOTRIPSY  2008  . MANDIBLE SURGERY  1985  . MASS EXCISION  11/25/2011   right hand-neuroma removed  . TUBAL LIGATION  2000    reports that she quit smoking about 5 years ago. Her smoking use included Cigarettes. She has never used smokeless tobacco. She reports that she drinks alcohol. She reports that she does not use drugs. family history includes Colon cancer in her maternal grandfather; Colon polyps in her father; Coronary artery disease in her father, maternal grandmother, and paternal grandmother; Diabetes in her father; Diverticulosis in her father; Hyperlipidemia in her father; Hypertension in her sister;  Lung cancer in her paternal grandfather. Allergies  Allergen Reactions  . Bee Venom Other (See Comments)    Patient receives antibiotic cream for infection due to sting  . Sulfonamide Derivatives Rash     Review of Systems  Constitutional: Negative for chills and fever.  HENT: Positive for ear pain and sore throat. Negative for ear discharge and hearing loss.   Respiratory: Negative for cough.   Neurological: Negative for headaches.  Hematological: Negative for adenopathy.       Objective:   Physical Exam  Constitutional: She appears well-developed and well-nourished.  HENT:  Right Ear: External ear normal.  Left Ear: External ear normal.  Mouth/Throat: Oropharynx is clear and moist. No oropharyngeal exudate.  Ear canals appear normal  Neck: Neck supple.  Cardiovascular: Normal rate and regular rhythm.   Pulmonary/Chest: Effort normal and breath sounds normal. No respiratory distress. She has no wheezes. She has no rales.  Lymphadenopathy:    She has no cervical adenopathy.       Assessment:     Left otalgia. Normal exam. No evidence for otitis media. Question mild eustachian tube dysfunction. Suspect viral URI.    Plan:     -Reassurance. Consider over-the-counter medications as needed. Follow-up for any fever or worsening symptoms  Eulas Post MD [facility]

## 2016-05-18 NOTE — Patient Instructions (Signed)

## 2016-05-18 NOTE — Progress Notes (Signed)
Pre visit review using our clinic review tool, if applicable. No additional management support is needed unless otherwise documented below in the visit note. 

## 2016-06-13 DIAGNOSIS — C44319 Basal cell carcinoma of skin of other parts of face: Secondary | ICD-10-CM | POA: Diagnosis not present

## 2016-06-14 DIAGNOSIS — C44319 Basal cell carcinoma of skin of other parts of face: Secondary | ICD-10-CM | POA: Diagnosis not present

## 2016-07-11 ENCOUNTER — Other Ambulatory Visit: Payer: Self-pay | Admitting: Certified Nurse Midwife

## 2016-07-11 NOTE — Telephone Encounter (Signed)
Medication refill request: Valtrex   Last AEX:  10-23-15  Next AEX: 10-24-16  Last MMG (if hormonal medication request): 02-29-16 WNL  Refill authorized: please advise

## 2016-07-20 DIAGNOSIS — D2262 Melanocytic nevi of left upper limb, including shoulder: Secondary | ICD-10-CM | POA: Diagnosis not present

## 2016-07-20 DIAGNOSIS — D2261 Melanocytic nevi of right upper limb, including shoulder: Secondary | ICD-10-CM | POA: Diagnosis not present

## 2016-07-20 DIAGNOSIS — L821 Other seborrheic keratosis: Secondary | ICD-10-CM | POA: Diagnosis not present

## 2016-07-20 DIAGNOSIS — D2271 Melanocytic nevi of right lower limb, including hip: Secondary | ICD-10-CM | POA: Diagnosis not present

## 2016-07-20 DIAGNOSIS — D225 Melanocytic nevi of trunk: Secondary | ICD-10-CM | POA: Diagnosis not present

## 2016-07-20 DIAGNOSIS — D485 Neoplasm of uncertain behavior of skin: Secondary | ICD-10-CM | POA: Diagnosis not present

## 2016-08-02 DIAGNOSIS — C4401 Basal cell carcinoma of skin of lip: Secondary | ICD-10-CM | POA: Diagnosis not present

## 2016-10-21 NOTE — Progress Notes (Signed)
Patient ID: Meghan Frank, female   DOB: 1966/05/18, 51 y.o.   MRN: [id number]  51 y.o. [id number] Divorced  Caucasian Fe here for annual exam.  Slight vaso symptoms that are tolerable.  No increase in vaginal dryness.  Also less symptoms of BV since a change of partner.  Basal cell cancer removed by Mohs' on right side of nose crease.  Ended last relationship of 11 yrs in June 2017.  Now new partner since April 2018.  Declines STD testing since his was negative.  Patient's last menstrual period was 01/04/2009.          Sexually active: Yes.   Same partner x 11 years that ended 11/2015. The current method of family planning is tubal ligation and ablation.    Exercising: No.  The patient does not participate in regular exercise at present. Smoker:  no  Health Maintenance: Pap: 10/21/14, Negative with neg HR HPV  08/11/11, Negative with neg HR HPV History of Abnormal Pap: about 2005 with colpo biopsy.   MMG: 02/29/16, 3D-yes, Density Category C, Bi-Rads 1:  Negative Self Breast exams: yes Colonoscopy: 2013, screening due in 10 yrs.per report BMD: Never TDaP: 07/20/10 Hep C: 02/26/16 HIV: 10/21/14 Labs: Fasting labs today   reports that she quit smoking about 5 years ago. Her smoking use included Cigarettes. She has never used smokeless tobacco. She reports that she drinks alcohol. She reports that she does not use drugs.  Past Medical History:  Diagnosis Date  . Abnormal Pap smear of cervix about 2005   colpo biopsy was benign  . Anemia   . Complication of anesthesia    "doesn't wake up well"  . Gallstone   . Internal/external hemorrhoids with bleeding 01/30/2012  . PONV (postoperative nausea and vomiting)   . Recurrent herpes labialis   . Recurrent nephrolithiasis     Past Surgical History:  Procedure Laterality Date  . CHOLECYSTECTOMY  2008  . COLONOSCOPY  01/30/2012   Procedure: COLONOSCOPY;  Surgeon: Gatha Mayer, MD;  Location: [facility];  Service: Endoscopy;  Laterality: N/A;   . CYSTOSCOPY/RETROGRADE/URETEROSCOPY/STONE EXTRACTION WITH BASKET  04/29/2011   Procedure: CYSTOSCOPY/RETROGRADE/URETEROSCOPY/STONE EXTRACTION WITH BASKET;  Surgeon: Ailene Rud, MD;  Location: [facility];  Service: Urology;  Laterality: Bilateral;  bilateral retrogrades, bilateral ureteroscopy, left stone basketry  . HEMORRHOID SURGERY  2007  . LASER ABLATION  2010   uterine ablation  . LITHOTRIPSY  2008  . MANDIBLE SURGERY  1985  . MASS EXCISION  11/25/2011   right hand-neuroma removed  . TUBAL LIGATION  2000    Current Outpatient Prescriptions  Medication Sig Dispense Refill  . ALPRAZolam (XANAX) 0.5 MG tablet Take 1 tablet (0.5 mg total) by mouth at bedtime as needed for anxiety. 30 tablet 0  . Multiple Vitamin (MULTIVITAMIN) tablet Take 1 tablet by mouth daily.    . valACYclovir (VALTREX) 1000 MG tablet TAKE 1 TABLET BY MOUTH TWICE DAILY FOR 3 DAYS, THEN 1 TABLET EVERY DAY TILL FEVER BLISTER IS GONE. 30 tablet 6   No current facility-administered medications for this visit.     Family History  Problem Relation Age of Onset  . Coronary artery disease Father   . Diabetes Father   . Hyperlipidemia Father   . Colon polyps Father   . Diverticulosis Father   . Coronary artery disease Paternal Grandmother   . Coronary artery disease Maternal Grandmother   . Colon cancer Maternal Grandfather   . Lung cancer Paternal Grandfather   .  Hypertension Sister     ROS:  Pertinent items are noted in HPI.  Otherwise, a comprehensive ROS was negative.  Exam:   BP 120/74 (BP Location: Right Arm, Patient Position: Sitting, Cuff Size: Normal)   Pulse 64   Ht 5' 3.75" (1.619 m)   Wt 144 lb (65.3 kg)   LMP 01/04/2009   BMI 24.91 kg/m  Height: 5' 3.75" (161.9 cm) Ht Readings from Last 3 Encounters:  10/24/16 5' 3.75" (1.619 m)  05/18/16 5\' 4"  (1.626 m)  02/26/16 5\' 4"  (1.626 m)    General appearance: alert, cooperative and appears stated age Head: Normocephalic, without obvious  abnormality, atraumatic Neck: no adenopathy, supple, symmetrical, trachea midline and thyroid normal to inspection and palpation Lungs: clear to auscultation bilaterally Breasts: normal appearance, no masses or tenderness Heart: regular rate and rhythm Abdomen: soft, non-tender; no masses,  no organomegaly Extremities: extremities normal, atraumatic, no cyanosis or edema Skin: Skin color, texture, turgor normal. No rashes or lesions Lymph nodes: Cervical, supraclavicular, and axillary nodes normal. No abnormal inguinal nodes palpated Neurologic: Grossly normal   Pelvic: External genitalia:  no lesions              Urethra:  normal appearing urethra with no masses, tenderness or lesions              Bartholin's and Skene's: normal                 Vagina: normal appearing vagina with normal color and discharge, no lesions              Cervix: anteverted              Pap taken: No. Bimanual Exam:  Uterus:  normal size, contour, position, consistency, mobility, non-tender              Adnexa: no mass, fullness, tenderness               Rectovaginal: Confirms               Anus:  normal sphincter tone, no lesions  Chaperone present: yes  A:  Well Woman with normal exam      S/P BTL 2000 S/P HTA 2010 with amenorrhea  History of chronic BV -none currently                P:   Reviewed health and wellness pertinent to exam  Pap smear: no  Mammogram is due 9/18  Follow with labs  Counseled on breast self exam, mammography screening, adequate intake of calcium and vitamin D, diet and exercise, Kegel's exercises return annually or prn  An After Visit Summary was printed and given to the patient.

## 2016-10-24 ENCOUNTER — Encounter: Payer: Self-pay | Admitting: Nurse Practitioner

## 2016-10-24 ENCOUNTER — Ambulatory Visit (INDEPENDENT_AMBULATORY_CARE_PROVIDER_SITE_OTHER): Payer: 59 | Admitting: Nurse Practitioner

## 2016-10-24 VITALS — BP 120/74 | HR 64 | Ht 63.75 in | Wt 144.0 lb

## 2016-10-24 DIAGNOSIS — Z01419 Encounter for gynecological examination (general) (routine) without abnormal findings: Secondary | ICD-10-CM

## 2016-10-24 DIAGNOSIS — Z0000 Encounter for general adult medical examination without abnormal findings: Secondary | ICD-10-CM | POA: Diagnosis not present

## 2016-10-24 LAB — CBC
HEMATOCRIT: 40.4 % (ref 35.0–45.0)
HEMOGLOBIN: 13.2 g/dL (ref 11.7–15.5)
MCH: 31.1 pg (ref 27.0–33.0)
MCHC: 32.7 g/dL (ref 32.0–36.0)
MCV: 95.3 fL (ref 80.0–100.0)
MPV: 10.8 fL (ref 7.5–12.5)
Platelets: 268 10*3/uL (ref 140–400)
RBC: 4.24 MIL/uL (ref 3.80–5.10)
RDW: 13.4 % (ref 11.0–15.0)
WBC: 6.8 10*3/uL (ref 3.8–10.8)

## 2016-10-24 MED ORDER — VALACYCLOVIR HCL 1 G PO TABS: ORAL_TABLET | ORAL | 6 refills | Status: DC

## 2016-10-24 NOTE — Patient Instructions (Signed)

## 2016-10-24 NOTE — Progress Notes (Signed)
Reviewed personally.  M. Suzanne Chole Driver, MD.  

## 2016-10-25 ENCOUNTER — Other Ambulatory Visit: Payer: Self-pay | Admitting: Nurse Practitioner

## 2016-10-25 DIAGNOSIS — R899 Unspecified abnormal finding in specimens from other organs, systems and tissues: Secondary | ICD-10-CM

## 2016-10-25 LAB — LIPID PANEL
Cholesterol: 154 mg/dL (ref <200)
HDL: 53 mg/dL (ref >50)
LDL CALC: 91 mg/dL (ref <100)
Total CHOL/HDL Ratio: 2.9 Ratio (ref <5.0)
Triglycerides: 51 mg/dL (ref <150)
VLDL: 10 mg/dL (ref <30)

## 2016-10-25 LAB — COMPREHENSIVE METABOLIC PANEL
ALT: 12 U/L (ref 6–29)
AST: 19 U/L (ref 10–35)
Albumin: 4.6 g/dL (ref 3.6–5.1)
Alkaline Phosphatase: 36 U/L (ref 33–130)
BILIRUBIN TOTAL: 2.1 mg/dL — AB (ref 0.2–1.2)
BUN: 14 mg/dL (ref 7–25)
CALCIUM: 9.5 mg/dL (ref 8.6–10.4)
CO2: 23 mmol/L (ref 20–31)
Chloride: 102 mmol/L (ref 98–110)
Creat: 0.76 mg/dL (ref 0.50–1.05)
GLUCOSE: 86 mg/dL (ref 65–99)
Potassium: 4.3 mmol/L (ref 3.5–5.3)
Sodium: 139 mmol/L (ref 135–146)
Total Protein: 7.3 g/dL (ref 6.1–8.1)

## 2016-10-25 LAB — HEMOGLOBIN A1C
Hgb A1c MFr Bld: 5 % (ref <5.7)
Mean Plasma Glucose: 97 mg/dL

## 2016-10-25 LAB — FOLLICLE STIMULATING HORMONE: FSH: 2.8 m[IU]/mL

## 2016-10-25 LAB — TSH: TSH: 1.52 m[IU]/L

## 2016-10-25 LAB — VITAMIN D 25 HYDROXY (VIT D DEFICIENCY, FRACTURES): VIT D 25 HYDROXY: 39 ng/mL (ref 30–100)

## 2017-01-09 ENCOUNTER — Telehealth: Payer: Self-pay | Admitting: Obstetrics and Gynecology

## 2017-01-09 NOTE — Telephone Encounter (Signed)
Left message on voicemail to call and reschedule cancelled appointment. Mail letter °

## 2017-01-10 ENCOUNTER — Telehealth: Payer: Self-pay | Admitting: *Deleted

## 2017-01-10 NOTE — Telephone Encounter (Signed)
-----   Message from Regina Eck, CNM sent at 12/26/2016  9:34 AM EDT ----- Regarding: Needs Hepatitic functin panel due elevated bilirubin, did not keep appointment Please call patient and remind of importance of this test follow up with regarding to normal liver functioning concern and reschedule

## 2017-01-10 NOTE — Telephone Encounter (Signed)
Message left on voicemail, OK per DPR, to return call to follow up on previous labs.

## 2017-01-18 ENCOUNTER — Encounter: Payer: Self-pay | Admitting: Obstetrics and Gynecology

## 2017-01-18 NOTE — Telephone Encounter (Signed)
I would recommend that the patient see her PCP.  She has an elevated bilirubin level chronically, which may actually be normal for her.  This may represent Gilbert's syndrome, a condition of elevated bilirubin level and periodic jaundice.  I am not certain that she has this, but I would recommend it to be clarified through her PCP.  Dr. Regis Bill is listed as her primary.  [place], Abelino Derrick

## 2017-01-18 NOTE — Telephone Encounter (Signed)
I spoke to the patient regarding need for repeat liver function tests. Patient states she had this happen before and repeat testing was normal. Patient declines appointment for repeat labs stating she feels that "those tests are unnecessary."  Please advise next step.  Routing to provider for review.

## 2017-01-18 NOTE — Telephone Encounter (Signed)
Routine to nursing supervisor.  Cc- Lamont Snowball

## 2017-01-18 NOTE — Telephone Encounter (Signed)
Spoke with patient. Patient states she did not have any questions or request f/u. Patient states she is not returning to office for labs, is aware abnormal labs. Patient states she does not know the recommending providers. Advised patient Kem Boroughs, NP has retired, providers in office are reviewing labs. Patient did not allow RN to review recommendations as seen below. Patient states "Thank you" and ended call.   Routing to provider for final review. Patient is agreeable to disposition. Will close encounter.  Cc: Melvia Heaps, CNM

## 2017-01-26 NOTE — Telephone Encounter (Signed)
Patient returned call today and stated she will have follow up. Patient apologized for abruptness last week.  Lab appointment scheduled for 01/30/17 to recheck liver function.

## 2017-01-30 ENCOUNTER — Other Ambulatory Visit (INDEPENDENT_AMBULATORY_CARE_PROVIDER_SITE_OTHER): Payer: 59

## 2017-01-30 DIAGNOSIS — R899 Unspecified abnormal finding in specimens from other organs, systems and tissues: Secondary | ICD-10-CM

## 2017-01-30 NOTE — Addendum Note (Signed)
Addended by: Charmayne Sheer on: 01/30/2017 08:26 AM   Modules accepted: Orders

## 2017-01-31 LAB — HEPATIC FUNCTION PANEL
ALT: 12 IU/L (ref 0–32)
AST: 18 IU/L (ref 0–40)
Albumin: 4.4 g/dL (ref 3.5–5.5)
Alkaline Phosphatase: 34 IU/L — ABNORMAL LOW (ref 39–117)
Bilirubin Total: 1 mg/dL (ref 0.0–1.2)
Bilirubin, Direct: 0.23 mg/dL (ref 0.00–0.40)
Total Protein: 6.8 g/dL (ref 6.0–8.5)

## 2017-02-24 ENCOUNTER — Encounter: Payer: Self-pay | Admitting: Internal Medicine

## 2017-03-30 ENCOUNTER — Other Ambulatory Visit: Payer: Self-pay | Admitting: Internal Medicine

## 2017-03-30 DIAGNOSIS — Z1231 Encounter for screening mammogram for malignant neoplasm of breast: Secondary | ICD-10-CM

## 2017-04-19 ENCOUNTER — Ambulatory Visit
Admission: RE | Admit: 2017-04-19 | Discharge: 2017-04-19 | Disposition: A | Discharge status: Home / Self Care | Payer: 59 | Source: Ambulatory Visit | Attending: Internal Medicine | Admitting: Internal Medicine

## 2017-04-19 DIAGNOSIS — Z1231 Encounter for screening mammogram for malignant neoplasm of breast: Secondary | ICD-10-CM | POA: Diagnosis not present

## 2017-05-09 ENCOUNTER — Other Ambulatory Visit: Payer: Self-pay | Admitting: *Deleted

## 2017-05-09 NOTE — Telephone Encounter (Signed)
Medication refill request: valtrex Last AEX:  10/24/16 PG  Next AEX: 10/27/17  Last MMG (if hormonal medication request): 04/19/17 BIRADS 1 negative  Refill authorized: 10/24/16 #30, 6RF. Today, please advise.

## 2017-05-10 MED ORDER — VALACYCLOVIR HCL 1 G PO TABS: ORAL_TABLET | ORAL | 6 refills | Status: DC

## 2017-06-13 DIAGNOSIS — H5213 Myopia, bilateral: Secondary | ICD-10-CM | POA: Diagnosis not present

## 2017-07-25 DIAGNOSIS — D485 Neoplasm of uncertain behavior of skin: Secondary | ICD-10-CM | POA: Diagnosis not present

## 2017-07-25 DIAGNOSIS — D225 Melanocytic nevi of trunk: Secondary | ICD-10-CM | POA: Diagnosis not present

## 2017-07-25 DIAGNOSIS — L718 Other rosacea: Secondary | ICD-10-CM | POA: Diagnosis not present

## 2017-07-25 DIAGNOSIS — Z85828 Personal history of other malignant neoplasm of skin: Secondary | ICD-10-CM | POA: Diagnosis not present

## 2017-10-24 NOTE — Progress Notes (Signed)
52 y.o. [id number] DivorcedCaucasianF here for annual exam.  No vaginal bleeding.  Having a little hair thinning.  Having some hot flashes.  No LMP recorded. Patient has had an ablation.          Sexually active: Yes.    The current method of family planning is tubal ligation.    Exercising: No.  no Smoker:  no  Health Maintenance:  Pap: 10-21-14 Neg:neg HR HPV, 08-11-11 Neg:Neg HR HPV History of abnormal Pap:  Yes, Hx colpo 2005 MMG:  04-19-17 Density C/Neg/BiRads1 Colonoscopy:  2013;next due 10 years BMD:   n/a TDaP:  07-20-10 Pneumonia vaccine(s):  no Shingrix:   No.  Reviewed with pt shingrix vaccination.   Hep C testing: 02-26-16 Neg Screening Labs: 2018   reports that she quit smoking about 6 years ago. Her smoking use included cigarettes. She has never used smokeless tobacco. She reports that she drinks about 0.6 oz of alcohol per week. She reports that she does not use drugs.  Past Medical History:  Diagnosis Date  . Abnormal Pap smear of cervix about 2005   colpo biopsy was benign  . Anemia   . Complication of anesthesia    "doesn't wake up well"  . Elevated bilirubin    declines evaluation.  . Gallstone   . Internal/external hemorrhoids with bleeding 01/30/2012  . PONV (postoperative nausea and vomiting)   . Recurrent herpes labialis   . Recurrent nephrolithiasis     Past Surgical History:  Procedure Laterality Date  . CHOLECYSTECTOMY  2008  . COLONOSCOPY  01/30/2012   Procedure: COLONOSCOPY;  Surgeon: Gatha Mayer, MD;  Location: [facility];  Service: Endoscopy;  Laterality: N/A;  . CYSTOSCOPY/RETROGRADE/URETEROSCOPY/STONE EXTRACTION WITH BASKET  04/29/2011   Procedure: CYSTOSCOPY/RETROGRADE/URETEROSCOPY/STONE EXTRACTION WITH BASKET;  Surgeon: Ailene Rud, MD;  Location: [facility];  Service: Urology;  Laterality: Bilateral;  bilateral retrogrades, bilateral ureteroscopy, left stone basketry  . HEMORRHOID SURGERY  2007  . LASER ABLATION  2010   uterine  ablation  . LITHOTRIPSY  2008  . MANDIBLE SURGERY  1985  . MASS EXCISION  11/25/2011   right hand-neuroma removed  . TUBAL LIGATION  2000    Current Outpatient Medications  Medication Sig Dispense Refill  . ALPRAZolam (XANAX) 0.5 MG tablet Take 1 tablet (0.5 mg total) by mouth at bedtime as needed for anxiety. 30 tablet 0  . Multiple Vitamin (MULTIVITAMIN) tablet Take 1 tablet by mouth daily.    . valACYclovir (VALTREX) 1000 MG tablet TAKE 1 TABLET BY MOUTH TWICE DAILY FOR 3 DAYS, THEN 1 TABLET EVERY DAY TILL FEVER BLISTER IS GONE. 30 tablet 6   No current facility-administered medications for this visit.     Family History  Problem Relation Age of Onset  . Coronary artery disease Father   . Diabetes Father   . Hyperlipidemia Father   . Colon polyps Father   . Diverticulosis Father   . Coronary artery disease Paternal Grandmother   . Coronary artery disease Maternal Grandmother   . Colon cancer Maternal Grandfather   . Lung cancer Paternal Grandfather   . Hypertension Sister     Review of Systems  Constitutional: Negative.   HENT: Negative.   Eyes: Negative.   Respiratory: Negative.   Cardiovascular: Negative.   Genitourinary: Negative.   Musculoskeletal: Negative.   Skin:       Hair loss  Endo/Heme/Allergies: Positive for polydipsia.  Psychiatric/Behavioral: Negative.     Exam:   BP 136/78 (BP Location: Right  Arm, Patient Position: Sitting, Cuff Size: Normal)   Pulse 76   Resp 14   Ht 5' 4.5" (1.638 m)   Wt 143 lb 3.2 oz (65 kg)   BMI 24.20 kg/m    Height: 5' 4.5" (163.8 cm)  Ht Readings from Last 3 Encounters:  10/27/17 5' 4.5" (1.638 m)  10/24/16 5' 3.75" (1.619 m)  05/18/16 5\' 4"  (1.626 m)    General appearance: alert, cooperative and appears stated age Head: Normocephalic, without obvious abnormality, atraumatic Neck: no adenopathy, supple, symmetrical, trachea midline and thyroid normal to inspection and palpation Lungs: clear to auscultation  bilaterally Breasts: normal appearance, no masses or tenderness Heart: regular rate and rhythm Abdomen: soft, non-tender; bowel sounds normal; no masses,  no organomegaly Extremities: extremities normal, atraumatic, no cyanosis or edema Skin: Skin color, texture, turgor normal. No rashes or lesions Lymph nodes: Cervical, supraclavicular, and axillary nodes normal. No abnormal inguinal nodes palpated Neurologic: Grossly normal   Pelvic: External genitalia:  no lesions              Urethra:  normal appearing urethra with no masses, tenderness or lesions              Bartholins and Skenes: normal                 Vagina: normal appearing vagina with normal color and discharge, no lesions              Cervix: no lesions              Pap taken: Yes.   Bimanual Exam:  Uterus:  Enlarged with possible fibroid off the right              Adnexa: normal adnexa and no mass, fullness, tenderness               Rectovaginal: Confirms               Anus:  normal sphincter tone, no lesions  Chaperone was present for exam.  A:  Well Woman with normal exam Enlarged uterus about 8 weeks size H/O HTA and BTL Mild night seats  P:   Mammogram guidelines reviewed  pap smear and HR HPV obtained today Return for PUS TSH and FSH will be obtained today Discussed Shingrix with pt today Return annually or prn

## 2017-10-25 ENCOUNTER — Ambulatory Visit: Payer: 59 | Admitting: Nurse Practitioner

## 2017-10-27 ENCOUNTER — Other Ambulatory Visit: Payer: Self-pay

## 2017-10-27 ENCOUNTER — Encounter: Payer: Self-pay | Admitting: Obstetrics & Gynecology

## 2017-10-27 ENCOUNTER — Other Ambulatory Visit (HOSPITAL_COMMUNITY)
Admission: RE | Admit: 2017-10-27 | Discharge: 2017-10-27 | Disposition: A | Discharge status: Home / Self Care | Payer: 59 | Source: Ambulatory Visit | Attending: Obstetrics & Gynecology | Admitting: Obstetrics & Gynecology

## 2017-10-27 ENCOUNTER — Ambulatory Visit (INDEPENDENT_AMBULATORY_CARE_PROVIDER_SITE_OTHER): Payer: 59 | Admitting: Obstetrics & Gynecology

## 2017-10-27 VITALS — BP 136/78 | HR 76 | Resp 14 | Ht 64.5 in | Wt 143.2 lb

## 2017-10-27 DIAGNOSIS — Z01419 Encounter for gynecological examination (general) (routine) without abnormal findings: Secondary | ICD-10-CM

## 2017-10-27 DIAGNOSIS — N852 Hypertrophy of uterus: Secondary | ICD-10-CM

## 2017-10-27 DIAGNOSIS — R61 Generalized hyperhidrosis: Secondary | ICD-10-CM

## 2017-10-27 DIAGNOSIS — Z124 Encounter for screening for malignant neoplasm of cervix: Secondary | ICD-10-CM | POA: Insufficient documentation

## 2017-10-27 DIAGNOSIS — Z0000 Encounter for general adult medical examination without abnormal findings: Secondary | ICD-10-CM | POA: Diagnosis not present

## 2017-10-27 NOTE — Progress Notes (Signed)
Patient scheduled while in office for PUS on 11/02/17 at 4pm, consult at 4:30 pm with Dr. Sabra Heck. Patient aware will be called with benefits prior to appt. Verbalizes understanding and is agreeable.

## 2017-10-28 LAB — TSH: TSH: 1.69 u[IU]/mL (ref 0.450–4.500)

## 2017-10-28 LAB — FOLLICLE STIMULATING HORMONE: FSH: 15.5 m[IU]/mL

## 2017-11-02 ENCOUNTER — Ambulatory Visit: Payer: 59 | Admitting: Obstetrics & Gynecology

## 2017-11-02 ENCOUNTER — Ambulatory Visit (INDEPENDENT_AMBULATORY_CARE_PROVIDER_SITE_OTHER): Payer: 59

## 2017-11-02 VITALS — BP 110/60 | HR 80 | Ht 64.5 in | Wt 145.0 lb

## 2017-11-02 DIAGNOSIS — D251 Intramural leiomyoma of uterus: Secondary | ICD-10-CM | POA: Diagnosis not present

## 2017-11-02 DIAGNOSIS — N852 Hypertrophy of uterus: Secondary | ICD-10-CM

## 2017-11-02 DIAGNOSIS — N83201 Unspecified ovarian cyst, right side: Secondary | ICD-10-CM

## 2017-11-02 NOTE — Progress Notes (Signed)
52 y.o. G63P2002 Divorced Caucasian female here for pelvic ultrasound due to enlarged uterus noted at time of physical exam earlier this month.  She has been seen for the past few years by Kem Boroughs.  With my exam, her uterus did feel enlarged and ultrasound was recommended.  Pt admits she was very nervous and scared after the last visit so is glad to be having the test completed today.    No LMP recorded. Patient has had an ablation.  Contraception: BLT  Findings:  UTERUS: 8.3 x 5.0 x 2.3 cm with at least 4 fibroids noted.  The largest measures 3.0 x 2.8 cm. EMS:3.0 mm ADNEXA: Left ovary:  2.1 x 1.4 x 0.8 cm       Right ovary: 2.4 x 2.4 x 1.7 cm with 2 follicles measuring 11 mm and 13 x 10 mm.  At least 1 of these appears to be a collapsed corpus luteum with thick walls.  This is avascular. CUL DE SAC: No free fluid  Discussion: Findings reviewed with patient.  Explanation for enlarged uterus is explained with the ultrasound today.  The nature of fibroids discussed.  Patient did have history of heavy bleeding but has done well since her ablation in 2010.  Unless there is no change on her physical exam I do not think that she is going to need additional follow-up at this time.  Patient has many questions and these were all addressed individually.  Assessment:  Enlarged uterus Uterine fibroids  Plan:  Follow up for routing exam in one year.  If findings on exam are changed, will proceed with repeat PUS at that time.    ~25 minutes spent with patient >50% of time was in face to face discussion of above.

## 2017-11-05 ENCOUNTER — Encounter: Payer: Self-pay | Admitting: Obstetrics & Gynecology

## 2017-11-06 LAB — CYTOLOGY - PAP
DIAGNOSIS: NEGATIVE
HPV 16/18/45 genotyping: POSITIVE — AB
HPV: DETECTED — AB

## 2017-11-08 ENCOUNTER — Other Ambulatory Visit: Payer: Self-pay | Admitting: *Deleted

## 2017-11-08 DIAGNOSIS — R87810 Cervical high risk human papillomavirus (HPV) DNA test positive: Secondary | ICD-10-CM

## 2017-11-09 ENCOUNTER — Ambulatory Visit (INDEPENDENT_AMBULATORY_CARE_PROVIDER_SITE_OTHER): Payer: 59 | Admitting: Obstetrics & Gynecology

## 2017-11-09 ENCOUNTER — Encounter: Payer: Self-pay | Admitting: Obstetrics & Gynecology

## 2017-11-09 VITALS — BP 122/66 | HR 84 | Resp 16 | Ht 64.5 in | Wt 144.0 lb

## 2017-11-09 DIAGNOSIS — R87810 Cervical high risk human papillomavirus (HPV) DNA test positive: Secondary | ICD-10-CM | POA: Diagnosis not present

## 2017-11-09 DIAGNOSIS — N888 Other specified noninflammatory disorders of cervix uteri: Secondary | ICD-10-CM | POA: Diagnosis not present

## 2017-11-09 DIAGNOSIS — N72 Inflammatory disease of cervix uteri: Secondary | ICD-10-CM | POA: Diagnosis not present

## 2017-11-09 NOTE — Patient Instructions (Signed)

## 2017-11-09 NOTE — Progress Notes (Signed)
52 y.o. Divorced Caucasian female G2P2 here for colposcopy with possible biopsies and/or ECC due to +HR HPV with +subtypes 18/45 Pap obtained 10/27/17 at [facility].  Has new sexual partner within the past few years so is aware of possibility of new HPV exposure.  No LMP recorded. Patient has had an ablation.          Sexually active: Yes.    The current method of family planning is tubal ligation.     Patient has been counseled about results and procedure.  Risks and benefits have bene reviewed including immediate and/or delayed bleeding, infection, cervical scaring from procedure, possibility of needing additional follow up as well as treatment.  rare risks of missing a lesion discussed as well.  All questions answered.  Pt ready to proceed.  BP 122/66 (BP Location: Right Arm, Patient Position: Sitting, Cuff Size: Normal)   Pulse 84   Resp 16   Ht 5' 4.5" (1.638 m)   Wt 144 lb (65.3 kg)   BMI 24.34 kg/m   Physical Exam  Constitutional: She is oriented to person, place, and time. She appears well-developed and well-nourished.  Genitourinary:    Neurological: She is alert and oriented to person, place, and time.  Skin: Skin is warm and dry.  Psychiatric: She has a normal mood and affect.   Speculum placed.  3% acetic acid applied to cervix for >45 seconds.  Cervix visualized with both 7.5X and 15X magnification.  Green filter also used.  Lugols solution was not used.  Findings: two areas of AWE noted with application of acetic acid.  Biopsy:  Noted at 6 and 11 o'clock positions.  ECC:  was performed.  Monsel's was needed.  Excellent hemostasis was present.  Pt tolerated procedure well and all instruments were removed.  Findings noted above on picture of cervix.  Assessment:  HR HPV with positive subtypes 18/45  Plan:  Pathology results will be called to patient and follow-up planned pending results.

## 2017-11-20 ENCOUNTER — Other Ambulatory Visit: Payer: Self-pay

## 2017-11-20 MED ORDER — VALACYCLOVIR HCL 1 G PO TABS: ORAL_TABLET | ORAL | 6 refills | Status: DC

## 2017-11-20 NOTE — Telephone Encounter (Signed)
Received faxed refill request for the following:   Medication refill request: Valtrex 1000mg  #30 Last AEX:  10-27-17 Next AEX: 02-01-19 Last MMG (if hormonal medication request): 04-19-17 [id number] Refill authorized: please advise

## 2017-11-24 DIAGNOSIS — H10523 Angular blepharoconjunctivitis, bilateral: Secondary | ICD-10-CM | POA: Diagnosis not present

## 2018-09-06 ENCOUNTER — Other Ambulatory Visit: Payer: Self-pay | Admitting: Obstetrics & Gynecology

## 2018-09-06 NOTE — Telephone Encounter (Signed)
Medication refill request: Valtrex Last AEX:  10/27/17 SM Next AEX: 02/01/19 Last MMG (if hormonal medication request): 04/19/17 BIRADS 1 negative/density c Refill authorized: Please advise, order pended for #30 w/4 refills if authorized

## 2018-11-26 ENCOUNTER — Telehealth: Payer: Self-pay | Admitting: Internal Medicine

## 2018-11-26 NOTE — Telephone Encounter (Signed)
Copied from [place] [id number]. Topic: General - Other >> Nov 22, 2018  1:19 PM Nils Flack, Marland Kitchen wrote: Reason for CRM: pt needs a face sheet mailed to her so she can use this as documentation to get a replacement social security card  Please mail to home address, verified address.

## 2018-12-04 NOTE — Telephone Encounter (Signed)
Pt called back in to follow up on face sheet that was requested in order to change her legal name due to a recent marriage.  Please assist pt further with receiving.    When trying to retrieve call back, was unable. Called pt back lvm making her aware that I sent a follow up message to office for assistance.

## 2018-12-05 NOTE — Telephone Encounter (Signed)
dont know what to do with this   [place] please get to the correct person

## 2018-12-06 NOTE — Telephone Encounter (Signed)
We are not sure how to handle this request. Please assist. Thanks!

## 2018-12-25 ENCOUNTER — Other Ambulatory Visit: Payer: Self-pay | Admitting: *Deleted

## 2018-12-25 MED ORDER — VALACYCLOVIR HCL 1 G PO TABS: ORAL_TABLET | ORAL | 1 refills | Status: DC

## 2018-12-25 NOTE — Telephone Encounter (Signed)
Medication refill request: Valtrex Last AEX:  10-27-17 SM   Next AEX: 02-01-2019 Last MMG (if hormonal medication request): n/a Refill authorized: Today, please advise.   Medication pended for #30, 1 RF. Please refill if appropriate.

## 2019-01-30 ENCOUNTER — Encounter: Payer: Self-pay | Admitting: Adult Health

## 2019-01-30 ENCOUNTER — Ambulatory Visit (INDEPENDENT_AMBULATORY_CARE_PROVIDER_SITE_OTHER): Payer: 59 | Admitting: Adult Health

## 2019-01-30 ENCOUNTER — Other Ambulatory Visit: Payer: Self-pay

## 2019-01-30 VITALS — BP 110/69 | HR 65 | Temp 98.5°F | Ht 64.0 in | Wt 155.0 lb

## 2019-01-30 DIAGNOSIS — Z1239 Encounter for other screening for malignant neoplasm of breast: Secondary | ICD-10-CM

## 2019-01-30 DIAGNOSIS — Z0000 Encounter for general adult medical examination without abnormal findings: Secondary | ICD-10-CM | POA: Diagnosis not present

## 2019-01-30 DIAGNOSIS — R5383 Other fatigue: Secondary | ICD-10-CM | POA: Diagnosis not present

## 2019-01-30 DIAGNOSIS — J351 Hypertrophy of tonsils: Secondary | ICD-10-CM | POA: Insufficient documentation

## 2019-01-30 NOTE — Addendum Note (Signed)
Addended by: Fonnie Mu on: 01/30/2019 03:06 PM   Modules accepted: Orders

## 2019-01-30 NOTE — Assessment & Plan Note (Signed)
Valtrex 1000mg  - 2 tabs BID for one day when flare occurs

## 2019-01-30 NOTE — Assessment & Plan Note (Signed)
Increase water intake, strive for at least 75 ounces/day.   Follow Mediterranean diet. Increase regular exercise.  Recommend at least 30 minutes daily, 5 days per week of walking, jogging, biking, swimming, YouTube/Pinterest workout videos. Please schedule fasting lab appt 1-2 weeks, complete physical 3 months. We will call you when lab results are available. Continue to social distance and wear mask when in public.

## 2019-01-30 NOTE — Patient Instructions (Signed)
Mediterranean Diet A Mediterranean diet refers to food and lifestyle choices that are based on the traditions of countries located on the [employer]. This way of eating has been shown to help prevent certain conditions and improve outcomes for people who have chronic diseases, like kidney disease and heart disease. What are tips for following this plan? Lifestyle  Cook and eat meals together with your family, when possible.  Drink enough fluid to keep your urine clear or pale yellow.  Be physically active every day. This includes: ? Aerobic exercise like running or swimming. ? Leisure activities like gardening, walking, or housework.  Get 7-8 hours of sleep each night.  If recommended by your health care provider, drink red wine in moderation. This means 1 glass a day for nonpregnant women and 2 glasses a day for men. A glass of wine equals 5 oz (150 mL). Reading food labels   Check the serving size of packaged foods. For foods such as rice and pasta, the serving size refers to the amount of cooked product, not dry.  Check the total fat in packaged foods. Avoid foods that have saturated fat or trans fats.  Check the ingredients list for added sugars, such as corn syrup. Shopping  At the grocery store, buy most of your food from the areas near the walls of the store. This includes: ? Fresh fruits and vegetables (produce). ? Grains, beans, nuts, and seeds. Some of these may be available in unpackaged forms or large amounts (in bulk). ? Fresh seafood. ? Poultry and eggs. ? Low-fat dairy products.  Buy whole ingredients instead of prepackaged foods.  Buy fresh fruits and vegetables in-season from local farmers markets.  Buy frozen fruits and vegetables in resealable bags.  If you do not have access to quality fresh seafood, buy precooked frozen shrimp or canned fish, such as tuna, salmon, or sardines.  Buy small amounts of raw or cooked vegetables, salads, or olives from  the deli or salad bar at your store.  Stock your pantry so you always have certain foods on hand, such as olive oil, canned tuna, canned tomatoes, rice, pasta, and beans. Cooking  Cook foods with extra-virgin olive oil instead of using butter or other vegetable oils.  Have meat as a side dish, and have vegetables or grains as your main dish. This means having meat in small portions or adding small amounts of meat to foods like pasta or stew.  Use beans or vegetables instead of meat in common dishes like chili or lasagna.  Experiment with different cooking methods. Try roasting or broiling vegetables instead of steaming or sauteing them.  Add frozen vegetables to soups, stews, pasta, or rice.  Add nuts or seeds for added healthy fat at each meal. You can add these to yogurt, salads, or vegetable dishes.  Marinate fish or vegetables using olive oil, lemon juice, garlic, and fresh herbs. Meal planning   Plan to eat 1 vegetarian meal one day each week. Try to work up to 2 vegetarian meals, if possible.  Eat seafood 2 or more times a week.  Have healthy snacks readily available, such as: ? Vegetable sticks with hummus. ? [place] yogurt. ? Fruit and nut trail mix.  Eat balanced meals throughout the week. This includes: ? Fruit: 2-3 servings a day ? Vegetables: 4-5 servings a day ? Low-fat dairy: 2 servings a day ? Fish, poultry, or lean meat: 1 serving a day ? Beans and legumes: 2 or more servings a week ?  Nuts and seeds: 1-2 servings a day ? Whole grains: 6-8 servings a day ? Extra-virgin olive oil: 3-4 servings a day  Limit red meat and sweets to only a few servings a month What are my food choices?  Mediterranean diet ? Recommended  Grains: Whole-grain pasta. Brown rice. Bulgar wheat. Polenta. Couscous. Whole-wheat bread. [place].  Vegetables: Artichokes. Beets. Broccoli. Cabbage. Carrots. Eggplant. Green beans. Chard. Kale. Spinach. Onions. Leeks. Peas. Squash.  Tomatoes. Peppers. Radishes.  Fruits: Apples. Apricots. Avocado. Berries. Bananas. Cherries. Dates. Figs. Grapes. Lemons. Melon. Oranges. Peaches. Plums. Pomegranate.  Meats and other protein foods: Beans. Almonds. Sunflower seeds. Pine nuts. Peanuts. [place]. Salmon. Scallops. Shrimp. [place]. Tilapia. Clams. Oysters. Eggs.  Dairy: Low-fat milk. Cheese. Greek yogurt.  Beverages: Water. Red wine. Herbal tea.  Fats and oils: Extra virgin olive oil. Avocado oil. Grape seed oil.  Sweets and desserts: [place] yogurt with honey. Baked apples. Poached pears. Trail mix.  Seasoning and other foods: Basil. Cilantro. Coriander. Cumin. Mint. Parsley. Sage. Rosemary. Tarragon. Garlic. Oregano. Thyme. Pepper. Balsalmic vinegar. Tahini. Hummus. Tomato sauce. Olives. Mushrooms. ? Limit these  Grains: Prepackaged pasta or rice dishes. Prepackaged cereal with added sugar.  Vegetables: Deep fried potatoes (french fries).  Fruits: Fruit canned in syrup.  Meats and other protein foods: Beef. Pork. Lamb. Poultry with skin. Hot dogs. [place] Salines.  Dairy: Ice cream. Sour cream. Whole milk.  Beverages: Juice. Sugar-sweetened soft drinks. Beer. Liquor and spirits.  Fats and oils: Butter. Canola oil. Vegetable oil. Beef fat (tallow). Lard.  Sweets and desserts: Cookies. Cakes. Pies. Candy.  Seasoning and other foods: Mayonnaise. Premade sauces and marinades. The items listed may not be a complete list. Talk with your dietitian about what dietary choices are right for you. Summary  The Mediterranean diet includes both food and lifestyle choices.  Eat a variety of fresh fruits and vegetables, beans, nuts, seeds, and whole grains.  Limit the amount of red meat and sweets that you eat.  Talk with your health care provider about whether it is safe for you to drink red wine in moderation. This means 1 glass a day for nonpregnant women and 2 glasses a day for men. A glass of wine equals 5 oz (150 mL). This information  is not intended to replace advice given to you by your health care provider. Make sure you discuss any questions you have with your health care provider. Document Released: 01/14/2016 Document Revised: 01/21/2016 Document Reviewed: 01/14/2016 Elsevier Patient Education  [place].  Increase water intake, strive for at least 75 ounces/day.   Follow Mediterranean diet. Increase regular exercise.  Recommend at least 30 minutes daily, 5 days per week of walking, jogging, biking, swimming, YouTube/Pinterest workout videos. Please schedule fasting lab appt 1-2 weeks, complete physical 3 months. We will call you when lab results are available. Continue to social distance and wear mask when in public. WELCOME TO THE PRACTICE!

## 2019-01-30 NOTE — Progress Notes (Signed)
Subjective:    Patient ID: Meghan Frank, female    DOB: 1965-09-01, 53 y.o.   MRN: [id number]  HPI:  Ms. Meghan Frank is here to establish as anew pt.  She is a pleasant 53 year old female. PMH: HSV 1Recent 15lb weight gain since 07/2018 Current wt 155 Body mass index is 26.61 kg/m.  She reports emotional lability, specifically being overally emotionally and more irritable than usual. She estimates to drink 30 oz water She reports increase CHP snacking and has not been exercising regularly. She has access to home "[employer]" She has been using her OB/GYN for all her care needs   Patient Care Team    Relationship Specialty Notifications Start End  Mina Marble D, NP PCP - General Family Medicine  01/30/19   Kem Boroughs, [place] Nurse Practitioner Nurse Practitioner  08/25/11   Carolan Clines, MD (Inactive) Attending Physician Urology  08/25/11   Burnis Medin, MD Consulting Physician Internal Medicine  01/30/19     Patient Active Problem List   Diagnosis Date Noted  . Healthcare maintenance 01/30/2019  . Hypertrophy of tonsils alone 01/30/2019  . Staphylococcus aureus infection 03/14/2014  . Abscess of axillary region 03/12/2014  . Nausea alone 12/27/2013  . Hx of urinary stone 12/27/2013  . Internal/external hemorrhoids with bleeding 01/30/2012  . Neuroma of hand 12/01/2011  . Sleep disorder 08/25/2011  . Fatigue 08/25/2011  . Moodiness 08/25/2011  . UNSPEC LOCAL INFECTION SKIN&SUBCUTANEOUS TISSUE 04/27/2010  . NEPHROLITHIASIS, HX OF 12/24/2008  . HERPES SIMPLEX INFECTION, RECURRENT 01/29/2007  . HEMATURIA 01/29/2007     Past Medical History:  Diagnosis Date  . Abnormal Pap smear of cervix about 2005   colpo biopsy was benign  . Anemia   . Complication of anesthesia    "doesn't wake up well"  . Elevated bilirubin    declines evaluation.  . Gallstone   . Internal/external hemorrhoids with bleeding 01/30/2012  . PONV (postoperative nausea and vomiting)   .  Recurrent herpes labialis   . Recurrent nephrolithiasis      Past Surgical History:  Procedure Laterality Date  . CHOLECYSTECTOMY  2008  . COLONOSCOPY  01/30/2012   Procedure: COLONOSCOPY;  Surgeon: Gatha Mayer, MD;  Location: [facility];  Service: Endoscopy;  Laterality: N/A;  . CYSTOSCOPY/RETROGRADE/URETEROSCOPY/STONE EXTRACTION WITH BASKET  04/29/2011   Procedure: CYSTOSCOPY/RETROGRADE/URETEROSCOPY/STONE EXTRACTION WITH BASKET;  Surgeon: Ailene Rud, MD;  Location: [facility];  Service: Urology;  Laterality: Bilateral;  bilateral retrogrades, bilateral ureteroscopy, left stone basketry  . HEMORRHOID SURGERY  2007  . LASER ABLATION  2010   uterine ablation  . LITHOTRIPSY  2008  . MANDIBLE SURGERY  1985  . MASS EXCISION  11/25/2011   right hand-neuroma removed  . TUBAL LIGATION  2000     Family History  Problem Relation Age of Onset  . Coronary artery disease Father   . Diabetes Father   . Hyperlipidemia Father   . Colon polyps Father   . Diverticulosis Father   . Heart attack Father   . Coronary artery disease Paternal Grandmother   . Coronary artery disease Maternal Grandmother   . Colon cancer Maternal Grandfather   . Alcohol abuse Maternal Grandfather   . Lung cancer Paternal Grandfather   . Hypertension Sister      Social History   Substance and Sexual Activity  Drug Use No     Social History   Substance and Sexual Activity  Alcohol Use Yes  . Alcohol/week: 1.0 standard  drinks  . Types: 1 Glasses of wine per week   Comment: occasional- couple times a year     Social History   Tobacco Use  Smoking Status Former Smoker  . Packs/day: 0.50  . Years: 5.00  . Pack years: 2.50  . Types: Cigarettes  . Quit date: 04/26/2011  . Years since quitting: 7.7  Smokeless Tobacco Never Used     Outpatient Encounter Medications as of 01/30/2019  Medication Sig  . Multiple Vitamin (MULTIVITAMIN) tablet Take 1 tablet by mouth daily.  . valACYclovir  (VALTREX) 1000 MG tablet TAKE 2 TABLETS (2000 MG) BY MOUTH TWICE DAILY FOR 1 DAY FOR FEVER BLISTER.   No facility-administered encounter medications on file as of 01/30/2019.     Allergies: Bee venom and Sulfonamide derivatives  Body mass index is 26.61 kg/m.  Blood pressure 110/69, pulse 65, temperature 98.5 F (36.9 C), temperature source Oral, height 5\' 4"  (1.626 m), weight 155 lb (70.3 kg), SpO2 99 %.     Review of Systems  Constitutional: Positive for fatigue and unexpected weight change. Negative for activity change, appetite change, chills, diaphoresis and fever.  HENT: Negative for congestion.   Eyes: Negative for visual disturbance.  Respiratory: Negative for cough, chest tightness, shortness of breath, wheezing and stridor.   Cardiovascular: Negative for chest pain, palpitations and leg swelling.  Gastrointestinal: Negative for abdominal distention, anal bleeding, blood in stool, constipation, diarrhea, nausea and vomiting.  Endocrine: Negative for cold intolerance, heat intolerance, polydipsia, polyphagia and polyuria.  Genitourinary: Negative for difficulty urinating and flank pain.  Musculoskeletal: Negative for neck pain and neck stiffness.  Skin:       R great toenail-thickened, discolored toenail  Neurological: Negative for dizziness and headaches.  Hematological: Negative for adenopathy. Does not bruise/bleed easily.  Psychiatric/Behavioral: Negative for agitation, behavioral problems, confusion, decreased concentration, dysphoric mood, hallucinations, self-injury, sleep disturbance and suicidal ideas. The patient is not nervous/anxious and is not hyperactive.        Objective:   Physical Exam Vitals signs and nursing note reviewed.  Constitutional:      General: She is not in acute distress.    Appearance: She is normal weight. She is not toxic-appearing.  HENT:     Head: Normocephalic and atraumatic.  Cardiovascular:     Rate and Rhythm: Normal rate and  regular rhythm.     Pulses: Normal pulses.     Heart sounds: Normal heart sounds. No murmur. No friction rub. No gallop.   Pulmonary:     Effort: Pulmonary effort is normal. No respiratory distress.     Breath sounds: Normal breath sounds. No stridor. No wheezing, rhonchi or rales.  Chest:     Chest wall: No tenderness.  Skin:    General: Skin is warm and dry.     Capillary Refill: Capillary refill takes less than 2 seconds.     Comments: R great toenail- Thickened, grey toenail   Neurological:     Mental Status: She is alert and oriented to person, place, and time.  Psychiatric:        Mood and Affect: Mood normal.        Behavior: Behavior normal.        Thought Content: Thought content normal.        Judgment: Judgment normal.           Assessment & Plan:   1. Screening for breast cancer   2. Fatigue, unspecified type   3. Healthcare maintenance  4. Hypertrophy of tonsils alone     Healthcare maintenance Increase water intake, strive for at least 75 ounces/day.   Follow Mediterranean diet. Increase regular exercise.  Recommend at least 30 minutes daily, 5 days per week of walking, jogging, biking, swimming, YouTube/Pinterest workout videos. Please schedule fasting lab appt 1-2 weeks, complete physical 3 months. We will call you when lab results are available. Continue to social distance and wear mask when in public.  HERPES SIMPLEX INFECTION, RECURRENT Valtrex 1000mg  - 2 tabs BID for one day when flare occurs  Hypertrophy of tonsils alone R great toenail- thickened, slightly grey Clipping sent for fungal testing     FOLLOW-UP:  Return in about 3 months (around 05/02/2019) for CPE.

## 2019-01-30 NOTE — Assessment & Plan Note (Signed)
R great toenail- thickened, slightly grey Clipping sent for fungal testing

## 2019-01-31 ENCOUNTER — Other Ambulatory Visit: Payer: Self-pay

## 2019-02-01 ENCOUNTER — Encounter: Payer: Self-pay | Admitting: Obstetrics & Gynecology

## 2019-02-01 ENCOUNTER — Telehealth: Payer: Self-pay | Admitting: *Deleted

## 2019-02-01 ENCOUNTER — Ambulatory Visit: Payer: 59 | Admitting: Obstetrics & Gynecology

## 2019-02-01 ENCOUNTER — Other Ambulatory Visit (HOSPITAL_COMMUNITY)
Admission: RE | Admit: 2019-02-01 | Discharge: 2019-02-01 | Disposition: A | Discharge status: Home / Self Care | Payer: 59 | Source: Ambulatory Visit | Attending: Obstetrics & Gynecology | Admitting: Obstetrics & Gynecology

## 2019-02-01 VITALS — BP 122/70 | HR 72 | Temp 98.0°F | Ht 64.0 in | Wt 151.6 lb

## 2019-02-01 DIAGNOSIS — Z124 Encounter for screening for malignant neoplasm of cervix: Secondary | ICD-10-CM | POA: Diagnosis not present

## 2019-02-01 DIAGNOSIS — N6310 Unspecified lump in the right breast, unspecified quadrant: Secondary | ICD-10-CM

## 2019-02-01 DIAGNOSIS — N951 Menopausal and female climacteric states: Secondary | ICD-10-CM | POA: Diagnosis not present

## 2019-02-01 DIAGNOSIS — Z01419 Encounter for gynecological examination (general) (routine) without abnormal findings: Secondary | ICD-10-CM | POA: Diagnosis not present

## 2019-02-01 DIAGNOSIS — B977 Papillomavirus as the cause of diseases classified elsewhere: Secondary | ICD-10-CM | POA: Insufficient documentation

## 2019-02-01 NOTE — Telephone Encounter (Signed)
Spoke with Meghan Frank at [facility]. Screening MMG cancelled for 03/15/19. Bilateral Dx MMG and right breast US, if needed, scheduled for 02/08/2019 at 8:10am, arrive at 7:50am.    Patient notified of appt and agreeable.   Routing to provider for final review. Patient is agreeable to disposition. Will close encounter.

## 2019-02-01 NOTE — Patient Instructions (Signed)
[place] Complete

## 2019-02-01 NOTE — Progress Notes (Signed)
53 y.o. G2P2002 Married White or Caucasian female here for annual exam.  Doing well.  Denies vaginal bleeding.  Having some issues with sleeping.  She is having some hot flashes.  Has been married since February.  Feeling more emotional.  Would like to consider options.  She has tried some OTC estrogen/progesterone cream.    Denies vaginal bleeding.    No LMP recorded. Patient has had an ablation.          Sexually active: Yes.    The current method of family planning is tubal ligation.    Exercising: Yes.    some Smoker:  no  Health Maintenance: Pap:  10/27/17 Neg. HR HPV:+detected. #18/45 positive  History of abnormal Pap:  yes MMG:  04/19/17 BIRADS1:neg. Has appt 03/15/19 Colonoscopy:  01/30/12 normal  BMD:   never TDaP:  08/13/2018 Pneumonia vaccine(s):  n/a Shingrix:   Reviewed today Hep C testing: 02/26/16 neg Screening Labs: hormone check    reports that she quit smoking about 7 years ago. Her smoking use included cigarettes. She has a 2.50 pack-year smoking history. She has never used smokeless tobacco. She reports current alcohol use. She reports that she does not use drugs.  Past Medical History:  Diagnosis Date  . Abnormal Pap smear of cervix about 2005   colpo biopsy was benign  . Anemia   . Complication of anesthesia    "doesn't wake up well"  . Elevated bilirubin    declines evaluation.  . Gallstone   . Internal/external hemorrhoids with bleeding 01/30/2012  . PONV (postoperative nausea and vomiting)   . Recurrent herpes labialis   . Recurrent nephrolithiasis     Past Surgical History:  Procedure Laterality Date  . CHOLECYSTECTOMY  2008  . COLONOSCOPY  01/30/2012   Procedure: COLONOSCOPY;  Surgeon: Gatha Mayer, MD;  Location: [facility];  Service: Endoscopy;  Laterality: N/A;  . CYSTOSCOPY/RETROGRADE/URETEROSCOPY/STONE EXTRACTION WITH BASKET  04/29/2011   Procedure: CYSTOSCOPY/RETROGRADE/URETEROSCOPY/STONE EXTRACTION WITH BASKET;  Surgeon: Ailene Rud, MD;  Location: [facility];  Service: Urology;  Laterality: Bilateral;  bilateral retrogrades, bilateral ureteroscopy, left stone basketry  . HEMORRHOID SURGERY  2007  . LASER ABLATION  2010   uterine ablation  . LITHOTRIPSY  2008  . MANDIBLE SURGERY  1985  . MASS EXCISION  11/25/2011   right hand-neuroma removed  . TUBAL LIGATION  2000    Current Outpatient Medications  Medication Sig Dispense Refill  . Multiple Vitamin (MULTIVITAMIN) tablet Take 1 tablet by mouth daily.    . valACYclovir (VALTREX) 1000 MG tablet TAKE 2 TABLETS (2000 MG) BY MOUTH TWICE DAILY FOR 1 DAY FOR FEVER BLISTER. 30 tablet 1   No current facility-administered medications for this visit.     Family History  Problem Relation Age of Onset  . Coronary artery disease Father   . Diabetes Father   . Hyperlipidemia Father   . Colon polyps Father   . Diverticulosis Father   . Heart attack Father   . Coronary artery disease Paternal Grandmother   . Coronary artery disease Maternal Grandmother   . Colon cancer Maternal Grandfather   . Alcohol abuse Maternal Grandfather   . Lung cancer Paternal Grandfather   . Hypertension Sister     Review of Systems  Endocrine:       Hot flashes at night   Psychiatric/Behavioral: Positive for behavioral problems and sleep disturbance. The patient is nervous/anxious.   All other systems reviewed and are negative.   Exam:  BP 122/70   Pulse 72   Temp 98 F (36.7 C) (Temporal)   Ht 5\' 4"  (1.626 m)   Wt 151 lb 9.6 oz (68.8 kg)   BMI 26.02 kg/m    Height: 5\' 4"  (162.6 cm)  Ht Readings from Last 3 Encounters:  02/01/19 5\' 4"  (1.626 m)  01/30/19 5\' 4"  (1.626 m)  11/09/17 5' 4.5" (1.638 m)    General appearance: alert, cooperative and appears stated age Head: Normocephalic, without obvious abnormality, atraumatic Neck: no adenopathy, supple, symmetrical, trachea midline and thyroid normal to inspection and palpation Lungs: clear to auscultation  bilaterally Breasts: normal appearance, no masses or tenderness on left, right nodule about 2cm in length, a little irregular, no LAD, skin changes, nipple discharge Heart: regular rate and rhythm Abdomen: soft, non-tender; bowel sounds normal; no masses,  no organomegaly Extremities: extremities normal, atraumatic, no cyanosis or edema Skin: Skin color, texture, turgor normal. No rashes or lesions Lymph nodes: Cervical, supraclavicular, and axillary nodes normal. No abnormal inguinal nodes palpated Neurologic: Grossly normal   Pelvic: External genitalia:  no lesions              Urethra:  normal appearing urethra with no masses, tenderness or lesions              Bartholins and Skenes: normal                 Vagina: normal appearing vagina with normal color and discharge, no lesions              Cervix: no lesions              Pap taken: Yes.   Bimanual Exam:  Uterus:  normal size, contour, position, consistency, mobility, non-tender              Adnexa: normal adnexa and no mass, fullness, tenderness               Rectovaginal: Confirms               Anus:  normal sphincter tone, no lesions  Chaperone was present for exam.  A:  Well Woman with normal exam H/o enlarged uterus due fibroids (PUS 2019) H/o HTA and BTL Menopausal symptoms H/o HR HPV on pap smear Aright breast mass  P:   Mammogram guidelines reviewed.  Diagnostic right MMG will be scheduled for pt pap smear and HR HPV obtained today Estradiol, FSH and total testosterone obtained today Routine lab work scheduled next week shingrix vaccination discussed today.  She is still considering this. return annually or prn

## 2019-02-01 NOTE — Telephone Encounter (Signed)
-----   Message from [name], MD sent at 02/01/2019 10:35 AM EDT ----- Regarding: mmg Pt needs diagnostic MMG on right for breast mass at 9 o'clock. She needs screening on the left.  This is overdue.  She has appt in October so this will need to be cancelled please.  She prefers AM appts if possible.  Thanks. Vinnie Level

## 2019-02-04 ENCOUNTER — Encounter: Payer: Self-pay | Admitting: Adult Health

## 2019-02-04 LAB — TESTOSTERONE, TOTAL, LC/MS/MS: Testosterone, total: 28.8 ng/dL

## 2019-02-04 LAB — FOLLICLE STIMULATING HORMONE: FSH: 4.1 m[IU]/mL

## 2019-02-04 LAB — ESTRADIOL: Estradiol: 98.7 pg/mL

## 2019-02-05 LAB — CYTOLOGY - PAP
Diagnosis: UNDETERMINED — AB
HPV: NOT DETECTED

## 2019-02-06 ENCOUNTER — Other Ambulatory Visit: Payer: 59

## 2019-02-06 ENCOUNTER — Other Ambulatory Visit: Payer: Self-pay

## 2019-02-06 DIAGNOSIS — R5383 Other fatigue: Secondary | ICD-10-CM

## 2019-02-06 DIAGNOSIS — Z0000 Encounter for general adult medical examination without abnormal findings: Secondary | ICD-10-CM

## 2019-02-07 ENCOUNTER — Encounter: Payer: Self-pay | Admitting: Adult Health

## 2019-02-07 LAB — COMPREHENSIVE METABOLIC PANEL
ALT: 11 IU/L (ref 0–32)
AST: 16 IU/L (ref 0–40)
Albumin/Globulin Ratio: 2.1 (ref 1.2–2.2)
Albumin: 4.4 g/dL (ref 3.8–4.9)
Alkaline Phosphatase: 40 IU/L (ref 39–117)
BUN/Creatinine Ratio: 20 (ref 9–23)
BUN: 15 mg/dL (ref 6–24)
Bilirubin Total: 1.3 mg/dL — ABNORMAL HIGH (ref 0.0–1.2)
CO2: 23 mmol/L (ref 20–29)
Calcium: 9 mg/dL (ref 8.7–10.2)
Chloride: 104 mmol/L (ref 96–106)
Creatinine, Ser: 0.75 mg/dL (ref 0.57–1.00)
GFR calc Af Amer: 106 mL/min/{1.73_m2} (ref >59)
GFR calc non Af Amer: 92 mL/min/{1.73_m2} (ref >59)
Globulin, Total: 2.1 g/dL (ref 1.5–4.5)
Glucose: 75 mg/dL (ref 65–99)
Potassium: 4.3 mmol/L (ref 3.5–5.2)
Sodium: 141 mmol/L (ref 134–144)
Total Protein: 6.5 g/dL (ref 6.0–8.5)

## 2019-02-07 LAB — LIPID PANEL
Chol/HDL Ratio: 2.9 ratio (ref 0.0–4.4)
Cholesterol, Total: 140 mg/dL (ref 100–199)
HDL: 49 mg/dL (ref >39)
LDL Chol Calc (NIH): 77 mg/dL (ref 0–99)
Triglycerides: 69 mg/dL (ref 0–149)
VLDL Cholesterol Cal: 14 mg/dL (ref 5–40)

## 2019-02-07 LAB — CBC WITH DIFFERENTIAL/PLATELET
Basophils Absolute: 0 10*3/uL (ref 0.0–0.2)
Basos: 1 %
EOS (ABSOLUTE): 0.1 10*3/uL (ref 0.0–0.4)
Eos: 2 %
Hematocrit: 37.3 % (ref 34.0–46.6)
Hemoglobin: 12.4 g/dL (ref 11.1–15.9)
Immature Grans (Abs): 0 10*3/uL (ref 0.0–0.1)
Immature Granulocytes: 0 %
Lymphocytes Absolute: 2.4 10*3/uL (ref 0.7–3.1)
Lymphs: 39 %
MCH: 31.9 pg (ref 26.6–33.0)
MCHC: 33.2 g/dL (ref 31.5–35.7)
MCV: 96 fL (ref 79–97)
Monocytes Absolute: 0.5 10*3/uL (ref 0.1–0.9)
Monocytes: 8 %
Neutrophils Absolute: 3.2 10*3/uL (ref 1.4–7.0)
Neutrophils: 50 %
Platelets: 257 10*3/uL (ref 150–450)
RBC: 3.89 x10E6/uL (ref 3.77–5.28)
RDW: 12.4 % (ref 11.7–15.4)
WBC: 6.3 10*3/uL (ref 3.4–10.8)

## 2019-02-07 LAB — HEMOGLOBIN A1C
Est. average glucose Bld gHb Est-mCnc: 100 mg/dL
Hgb A1c MFr Bld: 5.1 % (ref 4.8–5.6)

## 2019-02-07 LAB — TSH: TSH: 1.75 u[IU]/mL (ref 0.450–4.500)

## 2019-02-08 ENCOUNTER — Other Ambulatory Visit: Payer: Self-pay

## 2019-02-08 ENCOUNTER — Ambulatory Visit
Admission: RE | Admit: 2019-02-08 | Discharge: 2019-02-08 | Disposition: A | Discharge status: Home / Self Care | Payer: 59 | Source: Ambulatory Visit | Attending: Obstetrics & Gynecology | Admitting: Obstetrics & Gynecology

## 2019-02-08 DIAGNOSIS — N6310 Unspecified lump in the right breast, unspecified quadrant: Secondary | ICD-10-CM

## 2019-02-12 ENCOUNTER — Other Ambulatory Visit: Payer: Self-pay | Admitting: *Deleted

## 2019-02-12 MED ORDER — PROGESTERONE MICRONIZED 100 MG PO CAPS: 100.0000 mg | ORAL_CAPSULE | Freq: Every day | ORAL | 0 refills | Status: DC

## 2019-02-14 ENCOUNTER — Encounter: Payer: Self-pay | Admitting: Adult Health

## 2019-03-05 ENCOUNTER — Other Ambulatory Visit: Payer: Self-pay | Admitting: Obstetrics & Gynecology

## 2019-03-05 NOTE — Telephone Encounter (Signed)
Medication refill request: Valtrex Last AEX:  02/01/2019 SM Next AEX: 02/17/2020 Last MMG (if hormonal medication request): 02/08/2019 BIRADS 1 Negative Density C Refill authorized: Pending #[id number] with 1 refill if appropriate. Please advise.

## 2019-03-15 ENCOUNTER — Ambulatory Visit: Payer: 59

## 2019-04-08 LAB — FUNGUS CULTURE W SMEAR

## 2019-04-15 NOTE — Progress Notes (Signed)
Subjective:    Patient ID: Meghan Frank, female    DOB: 15-Dec-1965, 53 y.o.   MRN: [id number]  HPI: 01/30/2019 OV: Meghan Frank is here to establish as anew pt.  She is a pleasant 53 year old female. PMH: HSV 1Recent 15lb weight gain since 07/2018 Current wt 155 Body mass index is 26.61 kg/m.  She reports emotional lability, specifically being overally emotionally and more irritable than usual. She estimates to drink 30 oz water She reports increase CHP snacking and has not been exercising regularly. She has access to home "[employer]" She has been using her OB/GYN for all her care needs  04/16/2019 OV: Meghan Frank is here for CPE. She continues to drink sparse amount of water per day, approx 16 oz/day. She has reduced snacking and portion size at meals, however has not been exercising regularly. She is working remotely, so she does have more time in the day for exercise- discussed a few regimes she could start from home.  02/06/2019 Labs: Thyroid level is normal  Cholesterol panel- excellent  The 10-year ASCVD risk score (rick of stroke and heart attack) is: 1%- very good!  A1c (average of your blood sugar over the last 3 months)- 5.1, excellent  CMP-stable CBC-stable  Healthcare Maintenance: PAP-02/01/2019, abnormal- needs to repeat in one yr with OB/GYN Mammogram-UTD Colonoscopy-UTD, 01/30/2015-repeet in 10 years. IFOBT provided to complete at home.   Patient Care Team    Relationship Specialty Notifications Start End  Mina Marble D, NP PCP - General Family Medicine  01/30/19   Kem Boroughs, [place] Nurse Practitioner Nurse Practitioner  08/25/11   Carolan Clines, MD (Inactive) Attending Physician Urology  08/25/11   Panosh, Standley Brooking, MD Consulting Physician Internal Medicine  01/30/19   [name], MD Consulting Physician Gynecology  02/14/19     Patient Active Problem List   Diagnosis Date Noted  . Healthcare maintenance 01/30/2019  . Hypertrophy of tonsils alone  01/30/2019  . Staphylococcus aureus infection 03/14/2014  . Abscess of axillary region 03/12/2014  . Nausea alone 12/27/2013  . Hx of urinary stone 12/27/2013  . Internal/external hemorrhoids with bleeding 01/30/2012  . Neuroma of hand 12/01/2011  . Sleep disorder 08/25/2011  . Fatigue 08/25/2011  . Moodiness 08/25/2011  . UNSPEC LOCAL INFECTION SKIN&SUBCUTANEOUS TISSUE 04/27/2010  . NEPHROLITHIASIS, HX OF 12/24/2008  . HERPES SIMPLEX INFECTION, RECURRENT 01/29/2007  . HEMATURIA 01/29/2007     Past Medical History:  Diagnosis Date  . Abnormal Pap smear of cervix about 2005   colpo biopsy was benign  . Anemia   . Complication of anesthesia    "doesn't wake up well"  . Elevated bilirubin    declines evaluation.  . Gallstone   . Internal/external hemorrhoids with bleeding 01/30/2012  . PONV (postoperative nausea and vomiting)   . Recurrent herpes labialis   . Recurrent nephrolithiasis      Past Surgical History:  Procedure Laterality Date  . CHOLECYSTECTOMY  2008  . COLONOSCOPY  01/30/2012   Procedure: COLONOSCOPY;  Surgeon: Gatha Mayer, MD;  Location: [facility];  Service: Endoscopy;  Laterality: N/A;  . CYSTOSCOPY/RETROGRADE/URETEROSCOPY/STONE EXTRACTION WITH BASKET  04/29/2011   Procedure: CYSTOSCOPY/RETROGRADE/URETEROSCOPY/STONE EXTRACTION WITH BASKET;  Surgeon: Ailene Rud, MD;  Location: [facility];  Service: Urology;  Laterality: Bilateral;  bilateral retrogrades, bilateral ureteroscopy, left stone basketry  . HEMORRHOID SURGERY  2007  . LASER ABLATION  2010   uterine ablation  . LITHOTRIPSY  2008  . MANDIBLE SURGERY  1985  .  MASS EXCISION  11/25/2011   right hand-neuroma removed  . TUBAL LIGATION  2000     Family History  Problem Relation Age of Onset  . Coronary artery disease Father   . Diabetes Father   . Hyperlipidemia Father   . Colon polyps Father   . Diverticulosis Father   . Heart attack Father   . Coronary artery disease Paternal  Grandmother   . Coronary artery disease Maternal Grandmother   . Colon cancer Maternal Grandfather   . Alcohol abuse Maternal Grandfather   . Lung cancer Paternal Grandfather   . Hypertension Sister      Social History   Substance and Sexual Activity  Drug Use No     Social History   Substance and Sexual Activity  Alcohol Use Yes  . Alcohol/week: 0.0 - 1.0 standard drinks     Social History   Tobacco Use  Smoking Status Former Smoker  . Packs/day: 0.50  . Years: 5.00  . Pack years: 2.50  . Types: Cigarettes  . Quit date: 04/26/2011  . Years since quitting: 7.9  Smokeless Tobacco Never Used     Outpatient Encounter Medications as of 04/16/2019  Medication Sig  . Multiple Vitamin (MULTIVITAMIN) tablet Take 1 tablet by mouth daily.  . valACYclovir (VALTREX) 1000 MG tablet TAKE 2 TABLETS (2000 MG) BY MOUTH TWICE DAILY FOR 1 DAY FOR FEVER BLISTER.  . progesterone (PROMETRIUM) 100 MG capsule Take 1 capsule (100 mg total) by mouth daily. (Patient not taking: Reported on 04/16/2019)   No facility-administered encounter medications on file as of 04/16/2019.     Allergies: Bee venom and Sulfonamide derivatives  Body mass index is 27.07 kg/m.  Blood pressure 105/70, pulse 74, temperature 98.8 F (37.1 C), temperature source Oral, height 5\' 4"  (1.626 m), weight 157 lb 11.2 oz (71.5 kg), SpO2 99 %.   Review of Systems  Constitutional: Positive for fatigue. Negative for activity change, appetite change, chills, diaphoresis, fever and unexpected weight change.  HENT: Negative for congestion.   Eyes: Negative for visual disturbance.  Respiratory: Negative for cough, chest tightness, shortness of breath, wheezing and stridor.   Cardiovascular: Negative for chest pain, palpitations and leg swelling.  Gastrointestinal: Negative for abdominal distention, abdominal pain, anal bleeding, blood in stool, constipation, diarrhea, nausea and vomiting.       Hx of hemorrhoids +   Endocrine: Negative for polydipsia, polyphagia and polyuria.  Genitourinary: Negative for difficulty urinating and flank pain.  Musculoskeletal: Negative for arthralgias, back pain, gait problem, joint swelling, myalgias, neck pain and neck stiffness.  Neurological: Negative for dizziness and headaches.  Hematological: Negative for adenopathy. Does not bruise/bleed easily.  Psychiatric/Behavioral: Positive for sleep disturbance. Negative for agitation, behavioral problems, confusion, decreased concentration, dysphoric mood, hallucinations, self-injury and suicidal ideas. The patient is not nervous/anxious and is not hyperactive.        Objective:   Physical Exam Vitals signs and nursing note reviewed.  Constitutional:      General: She is not in acute distress.    Appearance: Normal appearance. She is not ill-appearing, toxic-appearing or diaphoretic.  HENT:     Head: Normocephalic and atraumatic.     Right Ear: Tympanic membrane, ear canal and external ear normal. There is no impacted cerumen.     Left Ear: Tympanic membrane, ear canal and external ear normal. There is no impacted cerumen.     Nose: Nose normal. No congestion.     Mouth/Throat:     Mouth: Mucous membranes  are moist.     Pharynx: No oropharyngeal exudate.  Eyes:     Extraocular Movements: Extraocular movements intact.     Conjunctiva/sclera: Conjunctivae normal.     Pupils: Pupils are equal, round, and reactive to light.  Neck:     Musculoskeletal: Normal range of motion and neck supple.  Cardiovascular:     Rate and Rhythm: Normal rate and regular rhythm.     Pulses: Normal pulses.     Heart sounds: Normal heart sounds. No murmur. No friction rub. No gallop.   Pulmonary:     Effort: Pulmonary effort is normal. No respiratory distress.     Breath sounds: Normal breath sounds. No wheezing, rhonchi or rales.  Chest:     Chest wall: No tenderness.  Abdominal:     General: Abdomen is flat. There is no distension.      Palpations: Abdomen is soft.     Tenderness: There is no abdominal tenderness. There is no right CVA tenderness, left CVA tenderness, guarding or rebound.     Hernia: No hernia is present.  Musculoskeletal: Normal range of motion.        General: No tenderness.     Right lower leg: No edema.     Left lower leg: No edema.  Skin:    General: Skin is warm.     Capillary Refill: Capillary refill takes less than 2 seconds.  Neurological:     Mental Status: She is alert and oriented to person, place, and time.     Coordination: Coordination normal.  Psychiatric:        Mood and Affect: Mood normal.        Behavior: Behavior normal.        Thought Content: Thought content normal.        Judgment: Judgment normal.       Assessment & Plan:   1. Need for influenza vaccination   2. Healthcare maintenance     Healthcare maintenance Increase water intake, strive for at least half of your body weight in ounces per day. Follow Heart Healthy diet. Increase regular exercise.  Recommend at least 30 minutes daily, 5 days per week of walking, jogging, biking, swimming, YouTube/Pinterest workout videos. Please complete Stool Cards and return to clinic. Continue with OB/GYN as directed. Recommend annual physical with fasting labs the week prior. Continue to social distance and wear a mask when in public.    FOLLOW-UP:  Return in about 1 year (around 04/15/2020) for Fasting Labs, CPE.

## 2019-04-16 ENCOUNTER — Ambulatory Visit (INDEPENDENT_AMBULATORY_CARE_PROVIDER_SITE_OTHER): Payer: 59 | Admitting: Adult Health

## 2019-04-16 ENCOUNTER — Encounter: Payer: Self-pay | Admitting: Adult Health

## 2019-04-16 ENCOUNTER — Other Ambulatory Visit: Payer: Self-pay

## 2019-04-16 VITALS — BP 105/70 | HR 74 | Temp 98.8°F | Ht 64.0 in | Wt 157.7 lb

## 2019-04-16 DIAGNOSIS — Z0000 Encounter for general adult medical examination without abnormal findings: Secondary | ICD-10-CM | POA: Diagnosis not present

## 2019-04-16 DIAGNOSIS — Z23 Encounter for immunization: Secondary | ICD-10-CM | POA: Diagnosis not present

## 2019-04-16 NOTE — Assessment & Plan Note (Signed)
Increase water intake, strive for at least half of your body weight in ounces per day. Follow Heart Healthy diet. Increase regular exercise.  Recommend at least 30 minutes daily, 5 days per week of walking, jogging, biking, swimming, YouTube/Pinterest workout videos. Please complete Stool Cards and return to clinic. Continue with OB/GYN as directed. Recommend annual physical with fasting labs the week prior. Continue to social distance and wear a mask when in public.

## 2019-04-16 NOTE — Patient Instructions (Addendum)
Preventive Care for Adults, Female  A healthy lifestyle and preventive care can promote health and wellness. Preventive health guidelines for women include the following key practices.   A routine yearly physical is a good way to check with your health care provider about your health and preventive screening. It is a chance to share any concerns and updates on your health and to receive a thorough exam.   Visit your dentist for a routine exam and preventive care every 6 months. Brush your teeth twice a day and floss once a day. Good oral hygiene prevents tooth decay and gum disease.   The frequency of eye exams is based on your age, health, family medical history, use of contact lenses, and other factors. Follow your health care provider's recommendations for frequency of eye exams.   Eat a healthy diet. Foods like vegetables, fruits, whole grains, low-fat dairy products, and lean protein foods contain the nutrients you need without too many calories. Decrease your intake of foods high in solid fats, added sugars, and salt. Eat the right amount of calories for you.Get information about a proper diet from your health care provider, if necessary.   Regular physical exercise is one of the most important things you can do for your health. Most adults should get at least 150 minutes of moderate-intensity exercise (any activity that increases your heart rate and causes you to sweat) each week. In addition, most adults need muscle-strengthening exercises on 2 or more days a week.   Maintain a healthy weight. The body mass index (BMI) is a screening tool to identify possible weight problems. It provides an estimate of body fat based on height and weight. Your health care provider can find your BMI, and can help you achieve or maintain a healthy weight.For adults 20 years and older:   - A BMI below 18.5 is considered underweight.   - A BMI of 18.5 to 24.9 is normal.   - A BMI of 25 to 29.9 is  considered overweight.   - A BMI of 30 and above is considered obese.   Maintain normal blood lipids and cholesterol levels by exercising and minimizing your intake of trans and saturated fats.  Eat a balanced diet with plenty of fruit and vegetables. Blood tests for lipids and cholesterol should begin at age 20 and be repeated every 5 years minimum.  If your lipid or cholesterol levels are high, you are over 40, or you are at high risk for heart disease, you may need your cholesterol levels checked more frequently.Ongoing high lipid and cholesterol levels should be treated with medicines if diet and exercise are not working.   If you smoke, find out from your health care provider how to quit. If you do not use tobacco, do not start.   Lung cancer screening is recommended for adults aged 55-80 years who are at high risk for developing lung cancer because of a history of smoking. A yearly low-dose CT scan of the lungs is recommended for people who have at least a 30-pack-year history of smoking and are a current smoker or have quit within the past 15 years. A pack year of smoking is smoking an average of 1 pack of cigarettes a day for 1 year (for example: 1 pack a day for 30 years or 2 packs a day for 15 years). Yearly screening should continue until the smoker has stopped smoking for at least 15 years. Yearly screening should be stopped for people who develop a   health problem that would prevent them from having lung cancer treatment.   If you are pregnant, do not drink alcohol. If you are breastfeeding, be very cautious about drinking alcohol. If you are not pregnant and choose to drink alcohol, do not have more than 1 drink per day. One drink is considered to be 12 ounces (355 mL) of beer, 5 ounces (148 mL) of wine, or 1.5 ounces (44 mL) of liquor.   Avoid use of street drugs. Do not share needles with anyone. Ask for help if you need support or instructions about stopping the use of  drugs.   High blood pressure causes heart disease and increases the risk of stroke. Your blood pressure should be checked at least yearly.  Ongoing high blood pressure should be treated with medicines if weight loss and exercise do not work.   If you are 80-41 years old, ask your health care provider if you should take aspirin to prevent strokes.   Diabetes screening involves taking a blood sample to check your fasting blood sugar level. This should be done once every 3 years, after age 16, if you are within normal weight and without risk factors for diabetes. Testing should be considered at a younger age or be carried out more frequently if you are overweight and have at least 1 risk factor for diabetes.   Breast cancer screening is essential preventive care for women. You should practice "breast self-awareness."  This means understanding the normal appearance and feel of your breasts and may include breast self-examination.  Any changes detected, no matter how small, should be reported to a health care provider.  Women in their 48s and 30s should have a clinical breast exam (CBE) by a health care provider as part of a regular health exam every 1 to 3 years.  After age 76, women should have a CBE every year.  Starting at age 44, women should consider having a mammogram (breast X-ray test) every year.  Women who have a family history of breast cancer should talk to their health care provider about genetic screening.  Women at a high risk of breast cancer should talk to their health care providers about having an MRI and a mammogram every year.   -Breast cancer gene (BRCA)-related cancer risk assessment is recommended for women who have family members with BRCA-related cancers. BRCA-related cancers include breast, ovarian, tubal, and peritoneal cancers. Having family members with these cancers may be associated with an increased risk for harmful changes (mutations) in the breast cancer genes BRCA1 and  BRCA2. Results of the assessment will determine the need for genetic counseling and BRCA1 and BRCA2 testing.   The Pap test is a screening test for cervical cancer. A Pap test can show cell changes on the cervix that might become cervical cancer if left untreated. A Pap test is a procedure in which cells are obtained and examined from the lower end of the uterus (cervix).   - Women should have a Pap test starting at age 21.   - Between ages 34 and 30, Pap tests should be repeated every 2 years.   - Beginning at age 44, you should have a Pap test every 3 years as long as the past 3 Pap tests have been normal.   - Some women have medical problems that increase the chance of getting cervical cancer. Talk to your health care provider about these problems. It is especially important to talk to your health care provider if a  new problem develops soon after your last Pap test. In these cases, your health care provider may recommend more frequent screening and Pap tests.   - The above recommendations are the same for women who have or have not gotten the vaccine for human papillomavirus (HPV).   - If you had a hysterectomy for a problem that was not cancer or a condition that could lead to cancer, then you no longer need Pap tests. Even if you no longer need a Pap test, a regular exam is a good idea to make sure no other problems are starting.   - If you are between ages 32 and 20 years, and you have had normal Pap tests going back 10 years, you no longer need Pap tests. Even if you no longer need a Pap test, a regular exam is a good idea to make sure no other problems are starting.   - If you have had past treatment for cervical cancer or a condition that could lead to cancer, you need Pap tests and screening for cancer for at least 20 years after your treatment.   - If Pap tests have been discontinued, risk factors (such as a new sexual partner) need to be reassessed to determine if screening should  be resumed.   - The HPV test is an additional test that may be used for cervical cancer screening. The HPV test looks for the virus that can cause the cell changes on the cervix. The cells collected during the Pap test can be tested for HPV. The HPV test could be used to screen women aged 48 years and older, and should be used in women of any age who have unclear Pap test results. After the age of 20, women should have HPV testing at the same frequency as a Pap test.   Colorectal cancer can be detected and often prevented. Most routine colorectal cancer screening begins at the age of 19 years and continues through age 27 years. However, your health care provider may recommend screening at an earlier age if you have risk factors for colon cancer. On a yearly basis, your health care provider may provide home test kits to check for hidden blood in the stool.  Use of a small camera at the end of a tube, to directly examine the colon (sigmoidoscopy or colonoscopy), can detect the earliest forms of colorectal cancer. Talk to your health care provider about this at age 18, when routine screening begins. Direct exam of the colon should be repeated every 5 -10 years through age 51 years, unless early forms of pre-cancerous polyps or small growths are found.   People who are at an increased risk for hepatitis B should be screened for this virus. You are considered at high risk for hepatitis B if:  -You were born in a country where hepatitis B occurs often. Talk with your health care provider about which countries are considered high risk.  - Your parents were born in a high-risk country and you have not received a shot to protect against hepatitis B (hepatitis B vaccine).  - You have HIV or AIDS.  - You use needles to inject street drugs.  - You live with, or have sex with, someone who has Hepatitis B.  - You get hemodialysis treatment.  - You take certain medicines for conditions like cancer, organ  transplantation, and autoimmune conditions.   Hepatitis C blood testing is recommended for all people born from [birth year] through 1965 and any individual  with known risks for hepatitis C.   Practice safe sex. Use condoms and avoid high-risk sexual practices to reduce the spread of sexually transmitted infections (STIs). STIs include gonorrhea, chlamydia, syphilis, trichomonas, herpes, HPV, and human immunodeficiency virus (HIV). Herpes, HIV, and HPV are viral illnesses that have no cure. They can result in disability, cancer, and death. Sexually active women aged 31 years and younger should be checked for chlamydia. Older women with new or multiple partners should also be tested for chlamydia. Testing for other STIs is recommended if you are sexually active and at increased risk.   Osteoporosis is a disease in which the bones lose minerals and strength with aging. This can result in serious bone fractures or breaks. The risk of osteoporosis can be identified using a bone density scan. Women ages 56 years and over and women at risk for fractures or osteoporosis should discuss screening with their health care providers. Ask your health care provider whether you should take a calcium supplement or vitamin D to There are also several preventive steps women can take to avoid osteoporosis and resulting fractures or to keep osteoporosis from worsening. -->Recommendations include:  Eat a balanced diet high in fruits, vegetables, calcium, and vitamins.  Get enough calcium. The recommended total intake of is 1,200 mg daily; for best absorption, if taking supplements, divide doses into 250-500 mg doses throughout the day. Of the two types of calcium, calcium carbonate is best absorbed when taken with food but calcium citrate can be taken on an empty stomach.  Get enough vitamin D. N[employer] and the [place] recommend at least 1,000 IU per day for women age 59 and over who are at risk of vitamin D  deficiency. Vitamin D deficiency can be caused by inadequate sun exposure (for example, those who live in [place]).  Avoid alcohol and smoking. Heavy alcohol intake (more than 7 drinks per week) increases the risk of falls and hip fracture and women smokers tend to lose bone more rapidly and have lower bone mass than nonsmokers. Stopping smoking is one of the most important changes women can make to improve their health and decrease risk for disease.  Be physically active every day. Weight-bearing exercise (for example, fast walking, hiking, jogging, and weight training) may strengthen bones or slow the rate of bone loss that comes with aging. Balancing and muscle-strengthening exercises can reduce the risk of falling and fracture.  Consider therapeutic medications. Currently, several types of effective drugs are available. Healthcare providers can recommend the type most appropriate for each woman.  Eliminate environmental factors that may contribute to accidents. Falls cause nearly 90% of all osteoporotic fractures, so reducing this risk is an important bone-health strategy. Measures include ample lighting, removing obstructions to walking, using nonskid rugs on floors, and placing mats and/or grab bars in showers.  Be aware of medication side effects. Some common medicines make bones weaker. These include a type of steroid drug called glucocorticoids used for arthritis and asthma, some antiseizure drugs, certain sleeping pills, treatments for endometriosis, and some cancer drugs. An overactive thyroid gland or using too much thyroid hormone for an underactive thyroid can also be a problem. If you are taking these medicines, talk to your doctor about what you can do to help protect your bones.reduce the rate of osteoporosis.    Menopause can be associated with physical symptoms and risks. Hormone replacement therapy is available to decrease symptoms and risks. You should talk to your  health care provider  about whether hormone replacement therapy is right for you.   Use sunscreen. Apply sunscreen liberally and repeatedly throughout the day. You should seek shade when your shadow is shorter than you. Protect yourself by wearing long sleeves, pants, a wide-brimmed hat, and sunglasses year round, whenever you are outdoors.   Once a month, do a whole body skin exam, using a mirror to look at the skin on your back. Tell your health care provider of new moles, moles that have irregular borders, moles that are larger than a pencil eraser, or moles that have changed in shape or color.   -Stay current with required vaccines (immunizations).   Influenza vaccine. All adults should be immunized every year.  Tetanus, diphtheria, and acellular pertussis (Td, Tdap) vaccine. Pregnant women should receive 1 dose of Tdap vaccine during each pregnancy. The dose should be obtained regardless of the length of time since the last dose. Immunization is preferred during the 27th 36th week of gestation. An adult who has not previously received Tdap or who does not know her vaccine status should receive 1 dose of Tdap. This initial dose should be followed by tetanus and diphtheria toxoids (Td) booster doses every 10 years. Adults with an unknown or incomplete history of completing a 3-dose immunization series with Td-containing vaccines should begin or complete a primary immunization series including a Tdap dose. Adults should receive a Td booster every 10 years.  Varicella vaccine. An adult without evidence of immunity to varicella should receive 2 doses or a second dose if she has previously received 1 dose. Pregnant females who do not have evidence of immunity should receive the first dose after pregnancy. This first dose should be obtained before leaving the health care facility. The second dose should be obtained 4 8 weeks after the first dose.  Human papillomavirus (HPV) vaccine. Females aged 13 26  years who have not received the vaccine previously should obtain the 3-dose series. The vaccine is not recommended for use in pregnant females. However, pregnancy testing is not needed before receiving a dose. If a female is found to be pregnant after receiving a dose, no treatment is needed. In that case, the remaining doses should be delayed until after the pregnancy. Immunization is recommended for any person with an immunocompromised condition through the age of 26 years if she did not get any or all doses earlier. During the 3-dose series, the second dose should be obtained 4 8 weeks after the first dose. The third dose should be obtained 24 weeks after the first dose and 16 weeks after the second dose.  Zoster vaccine. One dose is recommended for adults aged 60 years or older unless certain conditions are present.  Measles, mumps, and rubella (MMR) vaccine. Adults born before 1957 generally are considered immune to measles and mumps. Adults born in 1957 or later should have 1 or more doses of MMR vaccine unless there is a contraindication to the vaccine or there is laboratory evidence of immunity to each of the three diseases. A routine second dose of MMR vaccine should be obtained at least 28 days after the first dose for students attending postsecondary schools, health care workers, or international travelers. People who received inactivated measles vaccine or an unknown type of measles vaccine during 1963 1967 should receive 2 doses of MMR vaccine. People who received inactivated mumps vaccine or an unknown type of mumps vaccine before 1979 and are at high risk for mumps infection should consider immunization with 2 doses of   MMR vaccine. For females of childbearing age, rubella immunity should be determined. If there is no evidence of immunity, females who are not pregnant should be vaccinated. If there is no evidence of immunity, females who are pregnant should delay immunization until after pregnancy.  Unvaccinated health care workers born before [birth year] who lack laboratory evidence of measles, mumps, or rubella immunity or laboratory confirmation of disease should consider measles and mumps immunization with 2 doses of MMR vaccine or rubella immunization with 1 dose of MMR vaccine.  Pneumococcal 13-valent conjugate (PCV13) vaccine. When indicated, a person who is uncertain of her immunization history and has no record of immunization should receive the PCV13 vaccine. An adult aged 54 years or older who has certain medical conditions and has not been previously immunized should receive 1 dose of PCV13 vaccine. This PCV13 should be followed with a dose of pneumococcal polysaccharide (PPSV23) vaccine. The PPSV23 vaccine dose should be obtained at least 8 weeks after the dose of PCV13 vaccine. An adult aged 58 years or older who has certain medical conditions and previously received 1 or more doses of PPSV23 vaccine should receive 1 dose of PCV13. The PCV13 vaccine dose should be obtained 1 or more years after the last PPSV23 vaccine dose.  Pneumococcal polysaccharide (PPSV23) vaccine. When PCV13 is also indicated, PCV13 should be obtained first. All adults aged 58 years and older should be immunized. An adult younger than age 65 years who has certain medical conditions should be immunized. Any person who resides in a nursing home or long-term care facility should be immunized. An adult smoker should be immunized. People with an immunocompromised condition and certain other conditions should receive both PCV13 and PPSV23 vaccines. People with human immunodeficiency virus (HIV) infection should be immunized as soon as possible after diagnosis. Immunization during chemotherapy or radiation therapy should be avoided. Routine use of PPSV23 vaccine is not recommended for American Indians, [place] Natives, or people younger than 65 years unless there are medical conditions that require PPSV23 vaccine. When indicated,  people who have unknown immunization and have no record of immunization should receive PPSV23 vaccine. One-time revaccination 5 years after the first dose of PPSV23 is recommended for people aged 70 64 years who have chronic kidney failure, nephrotic syndrome, asplenia, or immunocompromised conditions. People who received 1 2 doses of PPSV23 before age 32 years should receive another dose of PPSV23 vaccine at age 96 years or later if at least 5 years have passed since the previous dose. Doses of PPSV23 are not needed for people immunized with PPSV23 at or after age 55 years.  Meningococcal vaccine. Adults with asplenia or persistent complement component deficiencies should receive 2 doses of quadrivalent meningococcal conjugate (MenACWY-D) vaccine. The doses should be obtained at least 2 months apart. Microbiologists working with certain meningococcal bacteria, [employer] recruits, people at risk during an outbreak, and people who travel to or live in countries with a high rate of meningitis should be immunized. A first-year college student up through age 58 years who is living in a residence hall should receive a dose if she did not receive a dose on or after her 16th birthday. Adults who have certain high-risk conditions should receive one or more doses of vaccine.  Hepatitis A vaccine. Adults who wish to be protected from this disease, have certain high-risk conditions, work with hepatitis A-infected animals, work in hepatitis A research labs, or travel to or work in countries with a high rate of hepatitis A should be  immunized. Adults who were previously unvaccinated and who anticipate close contact with an international adoptee during the first 60 days after arrival in the [place] from a country with a high rate of hepatitis A should be immunized.  Hepatitis B vaccine.  Adults who wish to be protected from this disease, have certain high-risk conditions, may be exposed to blood or other infectious  body fluids, are household contacts or sex partners of hepatitis B positive people, are clients or workers in certain care facilities, or travel to or work in countries with a high rate of hepatitis B should be immunized.  Haemophilus influenzae type b (Hib) vaccine. A previously unvaccinated person with asplenia or sickle cell disease or having a scheduled splenectomy should receive 1 dose of Hib vaccine. Regardless of previous immunization, a recipient of a hematopoietic stem cell transplant should receive a 3-dose series 6 12 months after her successful transplant. Hib vaccine is not recommended for adults with HIV infection.  Preventive Services / Frequency Ages 6 to 39years  Blood pressure check.** / Every 1 to 2 years.  Lipid and cholesterol check.** / Every 5 years beginning at age 39.  Clinical breast exam.** / Every 3 years for women in their 61s and 62s.  BRCA-related cancer risk assessment.** / For women who have family members with a BRCA-related cancer (breast, ovarian, tubal, or peritoneal cancers).  Pap test.** / Every 2 years from ages 47 through 85. Every 3 years starting at age 34 through age 12 or 74 with a history of 3 consecutive normal Pap tests.  HPV screening.** / Every 3 years from ages 46 through ages 43 to 54 with a history of 3 consecutive normal Pap tests.  Hepatitis C blood test.** / For any individual with known risks for hepatitis C.  Skin self-exam. / Monthly.  Influenza vaccine. / Every year.  Tetanus, diphtheria, and acellular pertussis (Tdap, Td) vaccine.** / Consult your health care provider. Pregnant women should receive 1 dose of Tdap vaccine during each pregnancy. 1 dose of Td every 10 years.  Varicella vaccine.** / Consult your health care provider. Pregnant females who do not have evidence of immunity should receive the first dose after pregnancy.  HPV vaccine. / 3 doses over 6 months, if 64 and younger. The vaccine is not recommended for use in  pregnant females. However, pregnancy testing is not needed before receiving a dose.  Measles, mumps, rubella (MMR) vaccine.** / You need at least 1 dose of MMR if you were born in 1957 or later. You may also need a 2nd dose. For females of childbearing age, rubella immunity should be determined. If there is no evidence of immunity, females who are not pregnant should be vaccinated. If there is no evidence of immunity, females who are pregnant should delay immunization until after pregnancy.  Pneumococcal 13-valent conjugate (PCV13) vaccine.** / Consult your health care provider.  Pneumococcal polysaccharide (PPSV23) vaccine.** / 1 to 2 doses if you smoke cigarettes or if you have certain conditions.  Meningococcal vaccine.** / 1 dose if you are age 71 to 37 years and a [employer] researcher living in a residence hall, or have one of several medical conditions, you need to get vaccinated against meningococcal disease. You may also need additional booster doses.  Hepatitis A vaccine.** / Consult your health care provider.  Hepatitis B vaccine.** / Consult your health care provider.  Haemophilus influenzae type b (Hib) vaccine.** / Consult your health care provider.  Ages 55 to 64years  Blood pressure check.** / Every 1 to 2 years.  Lipid and cholesterol check.** / Every 5 years beginning at age 20 years.  Lung cancer screening. / Every year if you are aged 55 80 years and have a 30-pack-year history of smoking and currently smoke or have quit within the past 15 years. Yearly screening is stopped once you have quit smoking for at least 15 years or develop a health problem that would prevent you from having lung cancer treatment.  Clinical breast exam.** / Every year after age 40 years.  BRCA-related cancer risk assessment.** / For women who have family members with a BRCA-related cancer (breast, ovarian, tubal, or peritoneal cancers).  Mammogram.** / Every year beginning at age 40  years and continuing for as long as you are in good health. Consult with your health care provider.  Pap test.** / Every 3 years starting at age 30 years through age 65 or 70 years with a history of 3 consecutive normal Pap tests.  HPV screening.** / Every 3 years from ages 30 years through ages 65 to 70 years with a history of 3 consecutive normal Pap tests.  Fecal occult blood test (FOBT) of stool. / Every year beginning at age 50 years and continuing until age 75 years. You may not need to do this test if you get a colonoscopy every 10 years.  Flexible sigmoidoscopy or colonoscopy.** / Every 5 years for a flexible sigmoidoscopy or every 10 years for a colonoscopy beginning at age 50 years and continuing until age 75 years.  Hepatitis C blood test.** / For all people born from 1945 through 1965 and any individual with known risks for hepatitis C.  Skin self-exam. / Monthly.  Influenza vaccine. / Every year.  Tetanus, diphtheria, and acellular pertussis (Tdap/Td) vaccine.** / Consult your health care provider. Pregnant women should receive 1 dose of Tdap vaccine during each pregnancy. 1 dose of Td every 10 years.  Varicella vaccine.** / Consult your health care provider. Pregnant females who do not have evidence of immunity should receive the first dose after pregnancy.  Zoster vaccine.** / 1 dose for adults aged 60 years or older.  Measles, mumps, rubella (MMR) vaccine.** / You need at least 1 dose of MMR if you were born in 1957 or later. You may also need a 2nd dose. For females of childbearing age, rubella immunity should be determined. If there is no evidence of immunity, females who are not pregnant should be vaccinated. If there is no evidence of immunity, females who are pregnant should delay immunization until after pregnancy.  Pneumococcal 13-valent conjugate (PCV13) vaccine.** / Consult your health care provider.  Pneumococcal polysaccharide (PPSV23) vaccine.** / 1 to 2 doses if  you smoke cigarettes or if you have certain conditions.  Meningococcal vaccine.** / Consult your health care provider.  Hepatitis A vaccine.** / Consult your health care provider.  Hepatitis B vaccine.** / Consult your health care provider.  Haemophilus influenzae type b (Hib) vaccine.** / Consult your health care provider.  Ages 65 years and over  Blood pressure check.** / Every 1 to 2 years.  Lipid and cholesterol check.** / Every 5 years beginning at age 20 years.  Lung cancer screening. / Every year if you are aged 55 80 years and have a 30-pack-year history of smoking and currently smoke or have quit within the past 15 years. Yearly screening is stopped once you have quit smoking for at least 15 years or develop a health problem that   would prevent you from having lung cancer treatment.  Clinical breast exam.** / Every year after age 58 years.  BRCA-related cancer risk assessment.** / For women who have family members with a BRCA-related cancer (breast, ovarian, tubal, or peritoneal cancers).  Mammogram.** / Every year beginning at age 9 years and continuing for as long as you are in good health. Consult with your health care provider.  Pap test.** / Every 3 years starting at age 51 years through age 2 or 46 years with 3 consecutive normal Pap tests. Testing can be stopped between 65 and 70 years with 3 consecutive normal Pap tests and no abnormal Pap or HPV tests in the past 10 years.  HPV screening.** / Every 3 years from ages 60 years through ages 59 or 67 years with a history of 3 consecutive normal Pap tests. Testing can be stopped between 65 and 70 years with 3 consecutive normal Pap tests and no abnormal Pap or HPV tests in the past 10 years.  Fecal occult blood test (FOBT) of stool. / Every year beginning at age 49 years and continuing until age 96 years. You may not need to do this test if you get a colonoscopy every 10 years.  Flexible sigmoidoscopy or colonoscopy.** /  Every 5 years for a flexible sigmoidoscopy or every 10 years for a colonoscopy beginning at age 50 years and continuing until age 14 years.  Hepatitis C blood test.** / For all people born from [birth year] through 1964 and any individual with known risks for hepatitis C.  Osteoporosis screening.** / A one-time screening for women ages 71 years and over and women at risk for fractures or osteoporosis.  Skin self-exam. / Monthly.  Influenza vaccine. / Every year.  Tetanus, diphtheria, and acellular pertussis (Tdap/Td) vaccine.** / 1 dose of Td every 10 years.  Varicella vaccine.** / Consult your health care provider.  Zoster vaccine.** / 1 dose for adults aged 71 years or older.  Pneumococcal 13-valent conjugate (PCV13) vaccine.** / Consult your health care provider.  Pneumococcal polysaccharide (PPSV23) vaccine.** / 1 dose for all adults aged 85 years and older.  Meningococcal vaccine.** / Consult your health care provider.  Hepatitis A vaccine.** / Consult your health care provider.  Hepatitis B vaccine.** / Consult your health care provider.  Haemophilus influenzae type b (Hib) vaccine.** / Consult your health care provider. ** Family history and personal history of risk and conditions may change your health care provider's recommendations. Document Released: 07/19/2001 Document Revised: 03/13/2013  [facility] Patient Information 2014 [place], [place].   EXERCISE AND DIET:  We recommended that you start or continue a regular exercise program for good health. Regular exercise means any activity that makes your heart beat faster and makes you sweat.  We recommend exercising at least 30 minutes per day at least 3 days a week, preferably 5.  We also recommend a diet low in fat and sugar / carbohydrates.  Inactivity, poor dietary choices and obesity can cause diabetes, heart attack, stroke, and kidney damage, among others.     ALCOHOL AND SMOKING:  Women should limit their alcohol intake to no  more than 7 drinks/beers/glasses of wine (combined, not each!) per week. Moderation of alcohol intake to this level decreases your risk of breast cancer and liver damage.  ( And of course, no recreational drugs are part of a healthy lifestyle.)  Also, you should not be smoking at all or even being exposed to second hand smoke. Most people know smoking can  cause cancer, and various heart and lung diseases, but did you know it also contributes to weakening of your bones?  Aging of your skin?  Yellowing of your teeth and nails?   CALCIUM AND VITAMIN D:  Adequate intake of calcium and Vitamin D are recommended.  The recommendations for exact amounts of these supplements seem to change often, but generally speaking 600 mg of calcium (either carbonate or citrate) and 800 units of Vitamin D per day seems prudent. Certain women may benefit from higher intake of Vitamin D.  If you are among these women, your doctor will have told you during your visit.     PAP SMEARS:  Pap smears, to check for cervical cancer or precancers,  have traditionally been done yearly, although recent scientific advances have shown that most women can have pap smears less often.  However, every woman still should have a physical exam from her gynecologist or primary care physician every year. It will include a breast check, inspection of the vulva and vagina to check for abnormal growths or skin changes, a visual exam of the cervix, and then an exam to evaluate the size and shape of the uterus and ovaries.  And after 52 years of age, a rectal exam is indicated to check for rectal cancers. We will also provide age appropriate advice regarding health maintenance, like when you should have certain vaccines, screening for sexually transmitted diseases, bone density testing, colonoscopy, mammograms, etc.    MAMMOGRAMS:  All women over 65 years old should have a yearly mammogram. Many facilities now offer a "3D" mammogram, which may cost  around $50 extra out of pocket. If possible,  we recommend you accept the option to have the 3D mammogram performed.  It both reduces the number of women who will be called back for extra views which then turn out to be normal, and it is better than the routine mammogram at detecting truly abnormal areas.     COLONOSCOPY:  Colonoscopy to screen for colon cancer is recommended for all women at age 47.  We know, you hate the idea of the prep.  We agree, BUT, having colon cancer and not knowing it is worse!!  Colon cancer so often starts as a polyp that can be seen and removed at colonscopy, which can quite literally save your life!  And if your first colonoscopy is normal and you have no family history of colon cancer, most women don't have to have it again for 10 years.  Once every ten years, you can do something that may end up saving your life, right?  We will be happy to help you get it scheduled when you are ready.  Be sure to check your insurance coverage so you understand how much it will cost.  It may be covered as a preventative service at no cost, but you should check your particular policy.   Increase water intake, strive for at least half of your body weight in ounces per day. Follow Heart Healthy diet. Increase regular exercise.  Recommend at least 30 minutes daily, 5 days per week of walking, jogging, biking, swimming, YouTube/Pinterest workout videos. Please complete Stool Cards and return to clinic. Continue with OB/GYN as directed. Recommend annual physical with fasting labs the week prior. Continue to social distance and wear a mask when in public. GREAT TO SEE YOU!

## 2019-05-01 ENCOUNTER — Other Ambulatory Visit: Payer: Self-pay

## 2019-05-01 DIAGNOSIS — Z20822 Contact with and (suspected) exposure to covid-19: Secondary | ICD-10-CM

## 2019-05-03 LAB — NOVEL CORONAVIRUS, NAA: SARS-CoV-2, NAA: NOT DETECTED

## 2019-07-22 ENCOUNTER — Other Ambulatory Visit: Payer: Self-pay | Admitting: Obstetrics & Gynecology

## 2019-07-22 NOTE — Telephone Encounter (Signed)
Medication refill request: Valtrex Last AEX:  02-01-2019 SM  Next AEX: 02-17-20 Last MMG (if hormonal medication request): n/a Refill authorized: today, please advise.   Medication pended for #30, 1RF. Please refill if appropriate.

## 2019-10-09 ENCOUNTER — Other Ambulatory Visit: Payer: Self-pay | Admitting: Obstetrics & Gynecology

## 2019-10-09 NOTE — Telephone Encounter (Signed)
Spoke with patient. Hx of Recurrent herpes labialis. Has RX for Valtrex 1000mg  take 2 tabs PO twice daily for 1 day for fever blister. Last Rx sent on 07/22/19, #30 tabs, 1 RF. Patient requesting that Rx be sent with more refills, states she has been prescribed 12 mo supply in the past. Has 6 tabs left.   RN asked patient is she is taking with outbreaks only or daily? Patient reports only with outbreak.   RN asked if she is experiencing more outbreaks? Patient states she is unsure, "can't answer this. Depends on the sun or increased stress".   RN asked if she has discussed suppression therapy? Discussed with Edman Circle, NP several years ago, does not desire to take daily medication unless absolutely necessary, has a hard time remembering to take.   Last AEX 02/01/19 Next AEX 02/17/20  Advised patient as needed medication is typically not prescribed for year supply. Will review request with Dr. Sabra Heck and f/u. Advised patient Dr. Sabra Heck is not in the office today, response may not be immediate.   Routing to Dr. Sabra Heck to advise on RX.

## 2019-10-09 NOTE — Telephone Encounter (Signed)
Patient is calling in regards to medication Valtrex. Patient states she is getting 2 month refills and does not know why it has changed to a short refill period. Patient states she was previously receiving a refill for the medication for a year and would like to go back to this refill period.

## 2019-10-10 NOTE — Telephone Encounter (Signed)
She is taking essentially the same thing as suppressive therapy.  Rx for #30/1RF done.  Will see if continues to use this frequently.  Thanks.

## 2019-10-10 NOTE — Telephone Encounter (Signed)
Call to patient, left detailed message, ok per dpr. Advised per Dr. Sabra Heck, f/u with pharmacy for refill. Return call to office if any additional questions.

## 2019-12-17 ENCOUNTER — Telehealth: Payer: Self-pay | Admitting: Nurse Practitioner

## 2019-12-17 NOTE — Telephone Encounter (Signed)
Pt last saw Tye Savoy in 2013. Pt is experiencing rectal bleeding (bright red) and would like to be seen as soon as possible.

## 2019-12-17 NOTE — Telephone Encounter (Signed)
Dr Carlean Purl patient. Patient last seen in 2013. Colonoscopy 01/30/2012. Hx of hemorrhoids. Calls today with reports of bright red blood per rectum. States she does have this periodically and will do sitz bathes with Epsom's salt. She reports she has seen spotting in her panties and today had spots of blood on her towel after her shower. She does not have pain when passing stool. She has some tenderness in the rectal area. She explains she has complicated anatomy that makes cleaning after a bowel movement difficult. She does use Prep H wipes as part of her routine. Appointment scheduled for tomorrow.

## 2019-12-17 NOTE — Telephone Encounter (Signed)
Called the patient back. No answer. Left her a message of the returned call on her voicemail.

## 2019-12-18 ENCOUNTER — Ambulatory Visit: Payer: 59 | Admitting: Nurse Practitioner

## 2019-12-18 ENCOUNTER — Encounter: Payer: Self-pay | Admitting: Nurse Practitioner

## 2019-12-18 ENCOUNTER — Other Ambulatory Visit (INDEPENDENT_AMBULATORY_CARE_PROVIDER_SITE_OTHER): Payer: 59

## 2019-12-18 VITALS — BP 118/70 | HR 80 | Ht 63.75 in | Wt 154.5 lb

## 2019-12-18 DIAGNOSIS — K625 Hemorrhage of anus and rectum: Secondary | ICD-10-CM | POA: Diagnosis not present

## 2019-12-18 DIAGNOSIS — R140 Abdominal distension (gaseous): Secondary | ICD-10-CM

## 2019-12-18 DIAGNOSIS — K644 Residual hemorrhoidal skin tags: Secondary | ICD-10-CM

## 2019-12-18 LAB — CBC WITH DIFFERENTIAL/PLATELET
Basophils Absolute: 0 10*3/uL (ref 0.0–0.1)
Basophils Relative: 0.5 % (ref 0.0–3.0)
Eosinophils Absolute: 0.1 10*3/uL (ref 0.0–0.7)
Eosinophils Relative: 0.9 % (ref 0.0–5.0)
HCT: 39.8 % (ref 36.0–46.0)
Hemoglobin: 13.3 g/dL (ref 12.0–15.0)
Lymphocytes Relative: 37 % (ref 12.0–46.0)
Lymphs Abs: 2.9 10*3/uL (ref 0.7–4.0)
MCHC: 33.5 g/dL (ref 30.0–36.0)
MCV: 94 fl (ref 78.0–100.0)
Monocytes Absolute: 0.6 10*3/uL (ref 0.1–1.0)
Monocytes Relative: 7.2 % (ref 3.0–12.0)
Neutro Abs: 4.2 10*3/uL (ref 1.4–7.7)
Neutrophils Relative %: 54.4 % (ref 43.0–77.0)
Platelets: 269 10*3/uL (ref 150.0–400.0)
RBC: 4.24 Mil/uL (ref 3.87–5.11)
RDW: 13.2 % (ref 11.5–15.5)
WBC: 7.8 10*3/uL (ref 4.0–10.5)

## 2019-12-18 LAB — COMPREHENSIVE METABOLIC PANEL
ALT: 12 U/L (ref 0–35)
AST: 13 U/L (ref 0–37)
Albumin: 4.6 g/dL (ref 3.5–5.2)
Alkaline Phosphatase: 37 U/L — ABNORMAL LOW (ref 39–117)
BUN: 15 mg/dL (ref 6–23)
CO2: 27 mEq/L (ref 19–32)
Calcium: 9.3 mg/dL (ref 8.4–10.5)
Chloride: 104 mEq/L (ref 96–112)
Creatinine, Ser: 0.8 mg/dL (ref 0.40–1.20)
GFR: 74.98 mL/min (ref >60.00)
Glucose, Bld: 99 mg/dL (ref 70–99)
Potassium: 4.1 mEq/L (ref 3.5–5.1)
Sodium: 137 mEq/L (ref 135–145)
Total Bilirubin: 0.7 mg/dL (ref 0.2–1.2)
Total Protein: 7.5 g/dL (ref 6.0–8.3)

## 2019-12-18 MED ORDER — NA SULFATE-K SULFATE-MG SULF 17.5-3.13-1.6 GM/177ML PO SOLN: 1.0000 | Freq: Once | ORAL | 0 refills | Status: AC

## 2019-12-18 NOTE — Patient Instructions (Addendum)
If you are age 53 or older, your body mass index should be between 23-30. Your Body mass index is 26.73 kg/m. If this is out of the aforementioned range listed, please consider follow up with your Primary Care Provider.  If you are age 55 or younger, your body mass index should be between 19-25. Your Body mass index is 26.73 kg/m. If this is out of the aformentioned range listed, please consider follow up with your Primary Care Provider.   Please purchase the following medications over the counter and take as directed:  Desitin, IBgard, Miralax .Apply a small amount of Desitin inside the anal opening and to the external anal area three times daily as needed for anal or hemorrhoidal irritation/bleeding.    Take Miralax 1 capful mixed in 8 ounces of water at bed time for constipation as tolerated.  Use IBguard 1 capsule twice daily for abdominal bloat and pain  Your provider has requested that you go to the basement level for lab work before leaving today. Press "B" on the elevator. The lab is located at the first door on the left as you exit the elevator.  Due to recent changes in healthcare laws, you may see the results of your imaging and laboratory studies on MyChart before your provider has had a chance to review them.  We understand that in some cases there may be results that are confusing or concerning to you. Not all laboratory results come back in the same time frame and the provider may be waiting for multiple results in order to interpret others.  Please give [place] 48 hours in order for your provider to thoroughly review all the results before contacting the office for clarification of your results.   Thank you for choosing [facility] Noralyn Pick, CRNP

## 2019-12-18 NOTE — Progress Notes (Signed)
12/18/2019 Meghan Frank [id number] 1966/01/29   CHIEF COMPLAINT: Hemorrhoids, rectal bleeding   HISTORY OF PRESENT ILLNESS: Meghan Frank is a 54 year old female with a past medical history of kidney stones, remote anemia secondary to menorrhagia which resolved following uterine ablation and facial  basal cell skin cancer.  Past hemorrhoidectomy, cholecystectomy and tubal ligation.  She presents today with complaints of hemorrhoidal pain and swelling.  She reported having a hemorrhoidectomy in 2010.significant improvement.  She continued to have external hemorrhoids with swelling since her surgery.  She complains of having more hemorrhoidal swelling with associated rectal bleeding for the past 4 days.  Yesterday, she saw a moderate amount of bright red blood on the towel after she showered.  She is soaking in a tub with Epson salt without significant relief.  She has intermittent abdominal gas discomfort which is relieved after she passes a bowel movement.  She is passing a normal formed brown bowel movement most days.  She denies straining.  She underwent a colonoscopy 01/30/2012 by Dr. Carlean Purl she identified internal hemorrhoids which were small and located near the dentate line and external hemorrhoids.  No polyps.  No weight loss.  No fever, sweats or chills. Father and sister with history of colon polyps. Maternal grandfather died from colon cancer age 17.   History of  prolonged sedation with general anesthesia.   Past Medical History:  Diagnosis Date  . Abnormal Pap smear of cervix about 2005   colpo biopsy was benign  . Anemia   . Complication of anesthesia    "doesn't wake up well"  . Elevated bilirubin    declines evaluation.  . Gallstone   . Internal/external hemorrhoids with bleeding 01/30/2012  . PONV (postoperative nausea and vomiting)   . Recurrent herpes labialis   . Recurrent nephrolithiasis    Past Surgical History:  Procedure Laterality Date  .  CHOLECYSTECTOMY  2008  . COLONOSCOPY  01/30/2012   Procedure: COLONOSCOPY;  Surgeon: Gatha Mayer, MD;  Location: [facility];  Service: Endoscopy;  Laterality: N/A;  . CYSTOSCOPY/RETROGRADE/URETEROSCOPY/STONE EXTRACTION WITH BASKET  04/29/2011   Procedure: CYSTOSCOPY/RETROGRADE/URETEROSCOPY/STONE EXTRACTION WITH BASKET;  Surgeon: Ailene Rud, MD;  Location: [facility];  Service: Urology;  Laterality: Bilateral;  bilateral retrogrades, bilateral ureteroscopy, left stone basketry  . HEMORRHOID SURGERY  2007  . LASER ABLATION  2010   uterine ablation  . LITHOTRIPSY  2008  . MANDIBLE SURGERY  1985  . MASS EXCISION  11/25/2011   right hand-neuroma removed  . TUBAL LIGATION  2000   Social History: Smoked cigarettes 2 ppd, quit smoking 8 years years. She drinks 1 glass of wine or mixed drink every few months. No drug use.   Family History: Father and sister with history of colon polyps. MGF died from colon cancer age 55. PGF died lung cancer age 39. Father also with DM, CAD MI.  Mother age 21 with thyroid disease.   Allergies  Allergen Reactions  . Bee Venom Other (See Comments)    Patient receives antibiotic cream for infection due to sting  . Sulfonamide Derivatives Rash      Outpatient Encounter Medications as of 12/18/2019  Medication Sig  . Multiple Vitamin (MULTIVITAMIN) tablet Take 1 tablet by mouth daily.  . valACYclovir (VALTREX) 1000 MG tablet TAKE 2 TABLETS BY MOUTH TWICE DAILY FOR 1 DAY FOR FEVER BLISTER. (Patient taking differently: as needed. TAKE 2 TABLETS BY MOUTH TWICE DAILY FOR 1 DAY FOR FEVER BLISTER.)  . [  DISCONTINUED] progesterone (PROMETRIUM) 100 MG capsule Take 1 capsule (100 mg total) by mouth daily. (Patient not taking: Reported on 04/16/2019)   No facility-administered encounter medications on file as of 12/18/2019.     REVIEW OF SYSTEMS:  Gen: Denies fever, sweats or chills. No weight loss.  CV: Denies chest pain, palpitations or edema. Resp: Denies  cough, shortness of breath of hemoptysis.  GI: Denies heartburn or dysphagia. See HPI.  GU : Denies urinary burning, blood in urine, increased urinary frequency or incontinence. MS: Denies joint pain, muscles aches or weakness. Derm: Denies rash, itchiness, skin lesions or unhealing ulcers. Psych: Denies depression, anxiety, memory loss or confusion. Heme: Denies bruising, bleeding. Neuro:  Denies headaches, dizziness or paresthesias. Endo:  Denies any problems with DM, thyroid or adrenal function.    PHYSICAL EXAM: BP 118/70 (BP Location: Left Arm, Patient Position: Sitting, Cuff Size: Normal)   Pulse 80   Ht 5' 3.75" (1.619 m) Comment: height measured without shoes  Wt 154 lb 8 oz (70.1 kg)   BMI 26.73 kg/m  General: Well developed  54 year old female in no acute distress. Head: Normocephalic and atraumatic. Eyes:  Sclerae non-icteric, conjunctive pink. Ears: Normal auditory acuity. Mouth: Dentition intact. No ulcers or lesions.  Neck: Supple, no lymphadenopathy or thyromegaly.  Lungs: Clear bilaterally to auscultation without wheezes, crackles or rhonchi. Heart: Regular rate and rhythm. No murmur, rub or gallop appreciated.  Abdomen: Soft, nontender, non distended. No masses. No hepatosplenomegaly. Normoactive bowel sounds x 4 quadrants.  Rectal: Circumferential external hemorrhoids, small area appears friable without active bleeding. No rectal prolapsed assessed. No significant internal hemorrhoids. Olivia Mackie CMA present during exam.  Musculoskeletal: Symmetrical with no gross deformities. Skin: Warm and dry. No rash or lesions on visible extremities. Extremities: No edema. Neurological: Alert oriented x 4, no focal deficits.  Psychological:  Alert and cooperative. Normal mood and affect.  ASSESSMENT AND PLAN:  73. 54 year old female with external hemorrhoids and rectal bleeding.  -CBC, CMP -Desitin for hemorrhoidal irritation PRN -Miralax Q HS PRN -Colonoscopy benefits and  risks discussed including risk with sedation, risk of bleeding, perforation and infection   2. Family history of colon polyps and colon cancer.  -See plan in # 1  3. Abdominal bloat/gas -Ibgard 1 po bid, samples given         CC:  No ref. provider found

## 2019-12-27 ENCOUNTER — Encounter: Payer: Self-pay | Admitting: Internal Medicine

## 2019-12-31 ENCOUNTER — Telehealth: Payer: Self-pay | Admitting: Nurse Practitioner

## 2019-12-31 NOTE — Telephone Encounter (Signed)
Spoke with the patient. She has not had any further GI or rectal issues since being seen on 12/18/19. I am unable to answer her questions about coding of the procedure. She does not have a personal history of polyps or colon cancer. Her colonoscopy is rescheduled to 02/05/20. Please review and advise on this. Thanks you

## 2019-12-31 NOTE — Telephone Encounter (Signed)
Pls call pt, she would like clarification about her upcoming colonoscopy. Pt states that she is not due for a repeat colon until 2023. However, when she came to see Jaclyn Shaggy she was told that she could have her procedure this year and it still be considered preventive because it is not related to the reason why she came to be seen for. Pt stated that she came for rectal bleeding and it was determined that they were caused by hemorrhoids so there was never a conversation that she needed colonoscopy to check on that. Instead she was told that we could have her colonoscopy this year. Pt needs to clarify if procedure would be coded as preventive because her insurance only covers that. If not, pt will cancel because she is not having any issues. Pls call her.

## 2020-01-01 NOTE — Telephone Encounter (Signed)
Patient given Dr. Celesta Aver recommendations. She wants to cancel the procedure. Procedure cancelled per her request.

## 2020-01-01 NOTE — Telephone Encounter (Signed)
From what I understand the idea of colonoscopy was to make sure that the bleeding was hemorrhoids which it probably was. I am not sure what was discussed about the coding but it would be preventive if we coded a family hx colon cancer and polyps but I think that is a gray zone as far as whether she needs one now or not.  So it is up to her but if the bleeding has stopped and hemorrhoids were seen can hold there and not do a colonoscopy if that is her desire.  Am happy to have her see me in follow-up to review in person also, and definitely if more bleeding

## 2020-01-07 ENCOUNTER — Encounter: Payer: 59 | Admitting: Internal Medicine

## 2020-02-05 ENCOUNTER — Encounter: Payer: 59 | Admitting: Internal Medicine

## 2020-02-14 NOTE — Progress Notes (Signed)
54 y.o. G49P2002 Married White or Caucasian female here for annual exam.  She is having much worse hot flashes over the last six months.  She is having a lot of trouble sleeping through the night.  She wakes up with hot flashes and sometimes has to take her clothes off.  She hasn't slept through the night six full months.  She is having trouble with her memory.    Has not gotten Covid vaccination.    No LMP recorded. Patient has had an ablation.          Sexually active: Yes.    The current method of family planning is tubal ligation.    Exercising: Yes.    walking Smoker:  no  Health Maintenance: Pap:  10-27-17 neg HPV HR+ 18/45+, 02-01-2019 ASCUS HPV HR neg History of abnormal Pap:  yes MMG:  02-08-2019 bilateral & rt breast u/s category c density birads 1:neg Colonoscopy:  01-30-12 normal BMD:   none TDaP:  2020 Pneumonia vaccine(s):  Not done Shingrix:   D/w pt today Hep C testing: neg 2017 Screening Labs: [facility] will be obtained today   reports that she quit smoking about 8 years ago. Her smoking use included cigarettes. She has a 2.50 pack-year smoking history. She has never used smokeless tobacco. She reports current alcohol use. She reports that she does not use drugs.  Past Medical History:  Diagnosis Date  . Abnormal Pap smear of cervix about 2005   colpo biopsy was benign  . Anemia   . Complication of anesthesia    "doesn't wake up well"  . Elevated bilirubin    declines evaluation.  . Gallstone   . Internal/external hemorrhoids with bleeding 01/30/2012  . PONV (postoperative nausea and vomiting)   . Recurrent herpes labialis   . Recurrent nephrolithiasis     Past Surgical History:  Procedure Laterality Date  . CHOLECYSTECTOMY  2008  . COLONOSCOPY  01/30/2012   Procedure: COLONOSCOPY;  Surgeon: Gatha Mayer, MD;  Location: [facility];  Service: Endoscopy;  Laterality: N/A;  . CYSTOSCOPY/RETROGRADE/URETEROSCOPY/STONE EXTRACTION WITH BASKET  04/29/2011   Procedure:  CYSTOSCOPY/RETROGRADE/URETEROSCOPY/STONE EXTRACTION WITH BASKET;  Surgeon: Ailene Rud, MD;  Location: [facility];  Service: Urology;  Laterality: Bilateral;  bilateral retrogrades, bilateral ureteroscopy, left stone basketry  . HEMORRHOID SURGERY  2007  . LASER ABLATION  2010   uterine ablation  . LITHOTRIPSY  2008  . MANDIBLE SURGERY  1985  . MASS EXCISION  11/25/2011   right hand-neuroma removed  . TUBAL LIGATION  2000    Current Outpatient Medications  Medication Sig Dispense Refill  . Multiple Vitamin (MULTIVITAMIN) tablet Take 1 tablet by mouth daily.    . valACYclovir (VALTREX) 1000 MG tablet TAKE 2 TABLETS BY MOUTH TWICE DAILY FOR 1 DAY FOR FEVER BLISTER. 30 tablet 6  . gabapentin (NEURONTIN) 100 MG capsule Take 1 capsule nighty x 3 nightly.  Can increase by 100mg  every 3 nights up to 400mg . 90 capsule 0  . gentamicin ointment (GARAMYCIN) 0.1 % SMARTSIG:Both Nares (Patient not taking: Reported on 02/17/2020)     No current facility-administered medications for this visit.    Family History  Problem Relation Age of Onset  . Coronary artery disease Father   . Diabetes Father   . Hyperlipidemia Father   . Colon polyps Father   . Diverticulosis Father   . Heart attack Father   . Coronary artery disease Paternal Grandmother   . Coronary artery disease Maternal Grandmother   .  Colon cancer Maternal Grandfather   . Alcohol abuse Maternal Grandfather   . Lung cancer Paternal Grandfather   . Hypertension Sister     Review of Systems  Constitutional: Negative.   HENT: Negative.   Eyes: Negative.   Respiratory: Negative.   Cardiovascular: Negative.   Gastrointestinal: Negative.   Endocrine: Negative.   Genitourinary: Negative.   Musculoskeletal: Negative.   Skin: Negative.   Allergic/Immunologic: Negative.   Neurological: Negative.   Hematological: Negative.   Psychiatric/Behavioral: Negative.     Exam:   BP 114/70   Pulse 68   Resp 16   Ht 5' 3.75" (1.619 m)    Wt 153 lb (69.4 kg)   BMI 26.47 kg/m   Height: 5' 3.75" (161.9 cm)  General appearance: alert, cooperative and appears stated age Head: Normocephalic, without obvious abnormality, atraumatic Neck: no adenopathy, supple, symmetrical, trachea midline and thyroid normal to inspection and palpation Lungs: clear to auscultation bilaterally Breasts: normal appearance, no masses or tenderness Heart: regular rate and rhythm Abdomen: soft, non-tender; bowel sounds normal; no masses,  no organomegaly Extremities: extremities normal, atraumatic, no cyanosis or edema Skin: Skin color, texture, turgor normal. No rashes or lesions Lymph nodes: Cervical, supraclavicular, and axillary nodes normal. No abnormal inguinal nodes palpated Neurologic: Grossly normal   Pelvic: External genitalia:  no lesions              Urethra:  normal appearing urethra with no masses, tenderness or lesions              Bartholins and Skenes: normal                 Vagina: normal appearing vagina with normal color and discharge, no lesions              Cervix: no lesions              Pap taken: Yes.   Bimanual Exam:  Uterus:  normal size, contour, position, consistency, mobility, non-tender              Adnexa: normal adnexa and no mass, fullness, tenderness               Rectovaginal: Confirms               Anus:  normal sphincter tone, no lesions  Chaperone, Terence Lux, CMA, was present for exam.  A:  Well Woman with normal exam H/o fibroid uterus (PUS 2019) H/O HTA and BTL Menopausal symptoms H/o HR HPV on pap  P:   Mammogram guidelines reviewed.   pap smear and HR HPV obtained today [place] obtained today Trial of gabapentin 100mg  increasing by 100mg  every 3 nights as needed RF for valtrex 1 gram, take 2, repeat 2 in 12 hours.  #30 with RFs to pharmacy. Do not recommend screening lab work today Shingrix vaccination discussed today Colonoscopy 2013 normal Return annually or prn

## 2020-02-17 ENCOUNTER — Other Ambulatory Visit (HOSPITAL_COMMUNITY)
Admission: RE | Admit: 2020-02-17 | Discharge: 2020-02-17 | Disposition: A | Discharge status: Home / Self Care | Payer: 59 | Source: Ambulatory Visit | Attending: Obstetrics & Gynecology | Admitting: Obstetrics & Gynecology

## 2020-02-17 ENCOUNTER — Ambulatory Visit: Payer: 59 | Admitting: Obstetrics & Gynecology

## 2020-02-17 ENCOUNTER — Other Ambulatory Visit: Payer: Self-pay

## 2020-02-17 ENCOUNTER — Encounter: Payer: Self-pay | Admitting: Obstetrics & Gynecology

## 2020-02-17 VITALS — BP 114/70 | HR 68 | Resp 16 | Ht 63.75 in | Wt 153.0 lb

## 2020-02-17 DIAGNOSIS — Z01419 Encounter for gynecological examination (general) (routine) without abnormal findings: Secondary | ICD-10-CM

## 2020-02-17 DIAGNOSIS — N951 Menopausal and female climacteric states: Secondary | ICD-10-CM | POA: Diagnosis not present

## 2020-02-17 DIAGNOSIS — Z124 Encounter for screening for malignant neoplasm of cervix: Secondary | ICD-10-CM | POA: Insufficient documentation

## 2020-02-17 MED ORDER — VALACYCLOVIR HCL 1 G PO TABS: ORAL_TABLET | ORAL | 6 refills | Status: DC

## 2020-02-17 MED ORDER — GABAPENTIN 100 MG PO CAPS: ORAL_CAPSULE | ORAL | 0 refills | Status: DC

## 2020-02-18 ENCOUNTER — Encounter: Payer: Self-pay | Admitting: Obstetrics & Gynecology

## 2020-02-18 ENCOUNTER — Telehealth: Payer: Self-pay

## 2020-02-18 LAB — CYTOLOGY - PAP
Comment: NEGATIVE
Diagnosis: NEGATIVE
High risk HPV: NEGATIVE

## 2020-02-18 LAB — FOLLICLE STIMULATING HORMONE: FSH: 94.5 m[IU]/mL

## 2020-02-18 NOTE — Telephone Encounter (Signed)
Routing to Dr Sabra Heck for results and recommendations.

## 2020-02-18 NOTE — Telephone Encounter (Signed)
Pt sent following mychart message:   Meghan Frank, Meghan Frank [facility] I read the result to this test and about fell out of my chair!   FSH 94.5 mIU/mL   I just never imagined it would be this high! I guess it explains my issues.

## 2020-02-19 NOTE — Telephone Encounter (Signed)
My chart message sent to pt.  She was given gabapentin to start and is to give update in 7 - 10 days.  She really did not want to start HRT and we discussed this as well.  She was concerned her symptoms will get worse and they will not with this [place] level. Advised her of this as well.  Ok to close encounter.

## 2020-02-19 NOTE — Telephone Encounter (Signed)
Seen by patient Meghan Frank on 02/19/2020 11:45 AM  Encounter closed.

## 2020-02-27 ENCOUNTER — Encounter: Payer: Self-pay | Admitting: Obstetrics & Gynecology

## 2020-02-27 ENCOUNTER — Telehealth: Payer: Self-pay | Admitting: *Deleted

## 2020-02-27 MED ORDER — ESTRADIOL 0.05 MG/24HR TD PTTW: 1.0000 | MEDICATED_PATCH | TRANSDERMAL | 1 refills | Status: DC

## 2020-02-27 MED ORDER — PROGESTERONE 200 MG PO CAPS: 200.0000 mg | ORAL_CAPSULE | Freq: Every day | ORAL | 1 refills | Status: DC

## 2020-02-27 NOTE — Telephone Encounter (Signed)
See telephone encounter dated 02/27/20.   Encounter closed.

## 2020-02-27 NOTE — Telephone Encounter (Signed)
Reviewed with Dr. Sabra Heck. Call returned to patient. Patient denies any other symptoms. Would prefer to allow symptoms to resolve before starting new RX. Patient is aware if symptoms do not resolve to seek evaluation at PCP/Urgent Care/ER.  Patient will stop gabapentin now, allow symptoms to resolve and start HRT.  Rx for Vivelle Dot 0.05 mg patch twice wkly #8/1RF and Prometrium 200 mg nightly days 1-15 each month, #15/1RF to verified pharmacy. MyChart visit scheduled for 10/21 at 4:30pm with Dr. Sabra Heck. Patient thankful for call.   Routing to provider for final review. Patient is agreeable to disposition. Will close encounter.

## 2020-02-27 NOTE — Telephone Encounter (Signed)
Patient is returning call.  °

## 2020-02-27 NOTE — Telephone Encounter (Addendum)
Left message to call Sharee Pimple, RN at [place].    Theodis Sato B  P [facility] I wanted to give you an update on the Rx (even though I have one more night to complete the full 3-day dosage of 400 mg). I have not felt better since I began this drug on the 13th. I am still having hot flashes, not sleeping any better and I am experiencing some side effects. I feel a bit off-kilter. Not super dizzy, but a little off center. I also feel a bit angry, or more likely to be upset over things that normally wouldn't bother me. I didn't think it would completely reverse my symptoms, but I was really hopeful that this would help. I would like to stop taking this medicine and try something that would not make me feel worse.

## 2020-03-26 ENCOUNTER — Telehealth (INDEPENDENT_AMBULATORY_CARE_PROVIDER_SITE_OTHER): Payer: 59 | Admitting: Obstetrics & Gynecology

## 2020-03-26 DIAGNOSIS — Z79890 Hormone replacement therapy (postmenopausal): Secondary | ICD-10-CM

## 2020-03-26 DIAGNOSIS — N951 Menopausal and female climacteric states: Secondary | ICD-10-CM

## 2020-03-26 MED ORDER — MEDROXYPROGESTERONE ACETATE 5 MG PO TABS: ORAL_TABLET | ORAL | 1 refills | Status: DC

## 2020-03-26 MED ORDER — ESTRADIOL 0.05 MG/24HR TD PTTW: 1.0000 | MEDICATED_PATCH | TRANSDERMAL | 1 refills | Status: DC

## 2020-03-26 NOTE — Progress Notes (Signed)
Virtual Visit via Video Note  I connected with Meghan Frank on 03/26/20 at  4:30 PM EDT by a video enabled telemedicine application and verified that I am speaking with the correct person using two identifiers.  Location: Patient: home Provider: office   I discussed the limitations of evaluation and management by telemedicine and the availability of in person appointments. The patient expressed understanding and agreed to proceed.  History of Present Illness: 54 yo G2P2 MWF for discussion of HRT after starting at the end of September.  [name] was 94 on 02/17/2020.  She was having significant hot flashes and vasomotor symptoms so HRT was started.  Estradiol 0.05 patches were recommended and prometrium.  Pt is having some side effects with the prometrium but feels the estradiol is a good dosage.  She is still having some mild hot flashes but they are so much improved that she feels comfortable staying at her current dosage.  She did not have withdrawal bleeding with the Prometrium.  Does understand the progesterone is important.  She also understands that the estradiol and progesterone could be combined and the progesterone dosage would be lower.  I would recommend at least another month or two with the current regimen of progesterone taken 1/2 each month to see if she is going to have any withdrawal bleeding.  If gets 3 months without bleeding would consider a combination HRT.  Pt voiced understanding of all of the above.   Observations/Objective: WNWD WF, NAD  Assessment and Plan: Vasomotor symptoms On HRT Side effects with prometrium  Follow Up Instructions: Will continue estradiol patch 0.05mg  patches, 1 to skin twice weekly.  Rx to pharmacy. Will switch to provera 5mg  days 1-15 each month.  #45/1RF Advised to give update after next progesterone dosage to see if symptoms are better with change in progesterone.    I discussed the assessment and treatment plan with the patient. The patient  was provided an opportunity to ask questions and all were answered. The patient agreed with the plan and demonstrated an understanding of the instructions.   The patient was advised to call back or seek an in-person evaluation if the symptoms worsen or if the condition fails to improve as anticipated.  I provided 22 minutes of non-face-to-face time during this encounter.   [name], MD

## 2020-03-29 ENCOUNTER — Encounter: Payer: Self-pay | Admitting: Obstetrics & Gynecology

## 2020-03-29 DIAGNOSIS — N951 Menopausal and female climacteric states: Secondary | ICD-10-CM | POA: Insufficient documentation

## 2020-03-29 DIAGNOSIS — Z79890 Hormone replacement therapy (postmenopausal): Secondary | ICD-10-CM | POA: Insufficient documentation

## 2020-04-10 ENCOUNTER — Ambulatory Visit: Payer: 59 | Admitting: Obstetrics & Gynecology

## 2020-06-10 ENCOUNTER — Other Ambulatory Visit: Payer: Self-pay

## 2020-06-10 ENCOUNTER — Encounter: Payer: Self-pay | Admitting: Physician Assistant

## 2020-06-10 ENCOUNTER — Ambulatory Visit (INDEPENDENT_AMBULATORY_CARE_PROVIDER_SITE_OTHER): Payer: BC Managed Care – PPO | Admitting: Physician Assistant

## 2020-06-10 VITALS — Ht 64.0 in | Wt 157.0 lb

## 2020-06-10 DIAGNOSIS — J069 Acute upper respiratory infection, unspecified: Secondary | ICD-10-CM

## 2020-06-10 LAB — POCT INFLUENZA A/B
Influenza A, POC: NEGATIVE
Influenza B, POC: NEGATIVE

## 2020-06-10 MED ORDER — AMOXICILLIN-POT CLAVULANATE 875-125 MG PO TABS: 1.0000 | ORAL_TABLET | Freq: Two times a day (BID) | ORAL | 0 refills | Status: DC

## 2020-06-10 NOTE — Progress Notes (Signed)
Telehealth office visit note for Mayer Masker, PA-C- at Primary Care at [facility]   I connected with current patient today by telephone and verified that I am speaking with the correct person   . Location of the patient: Home . Location of the provider: Office - This visit type was conducted due to national recommendations for restrictions regarding the COVID-19 Pandemic (e.g. social distancing) in an effort to limit this patient's exposure and mitigate transmission in our community.    - No physical exam could be performed with this format, beyond that communicated to [place] by the patient/ family members as noted.   - Additionally my office staff/ schedulers were to discuss with the patient that there may be a monetary charge related to this service, depending on their medical insurance.  My understanding is that patient understood and consented to proceed.     _________________________________________________________________________________   History of Present Illness: Patient calls in with upper respiratory symptoms of runny nose, sneezing, nasal congestion, cough, postnasal drainage and sore throat with excessive coughing or talking. Symptoms started early December, shortly after influenza vaccine. Denies fever, chills, swollen lymph nodes, exudates, shortness of breath, or loss of taste/smell. Over the weekend had discolored mucus. Went for [employer] testing at [facility] Saturday (4 days ago) and still waiting for results. Has been taking Mucinex with minimal relief.      No flowsheet data found.  Depression screen [facility] 2/9 06/10/2020 04/16/2019 01/30/2019  Decreased Interest 0 0 1  Down, Depressed, Hopeless 0 0 1  PHQ - 2 Score 0 0 2  Altered sleeping 0 1 1  Tired, decreased energy 0 0 0  Change in appetite 0 0 3  Feeling bad or failure about yourself  0 0 0  Trouble concentrating 0 0 0  Moving slowly or fidgety/restless 0 0 0  Suicidal thoughts 0 0 0  PHQ-9 Score 0 1 6   Difficult doing work/chores - Not difficult at all Not difficult at all      Impression and Recommendations:     1. Upper respiratory tract infection, unspecified type   -Recommend flu testing to r/o influenza infection. Given symptoms have been ongoing for >4 weeks will start antibiotic therapy. Pending Covid results, if positive recommend to follow CDC isolation guidelines.  -Continue home supportive therapy and stay well hydrated. Monitor symptoms and recommend to seek immediate medical care if starts experiencing red flag s/sxs such as shortness of breath, confusion, chest pain, or palpitations.  -Of note, influenza test resulted negative.     - As part of my medical decision making, I reviewed the following data within the electronic MEDICAL RECORD NUMBER History obtained from pt /family, CMA notes reviewed and incorporated if applicable, Labs reviewed, Radiograph/ tests reviewed if applicable and OV notes from prior OV's with me, as well as any other specialists she/he has seen since seeing me last, were all reviewed and used in my medical decision making process today.    - Additionally, when appropriate, discussion had with patient regarding our treatment plan, and their biases/concerns about that plan were used in my medical decision making today.    - The patient agreed with the plan and demonstrated an understanding of the instructions.   No barriers to understanding were identified.     - The patient was advised to call back or seek an in-person evaluation if the symptoms worsen or if the condition fails to improve as anticipated.  Return if symptoms worsen or fail to improve.    Orders Placed This Encounter  Procedures  . POCT Influenza A/B    Meds ordered this encounter  Medications  . amoxicillin-clavulanate (AUGMENTIN) 875-125 MG tablet    Sig: Take 1 tablet by mouth 2 (two) times daily.    Dispense:  14 tablet    Refill:  0    Order Specific Question:    Supervising Provider    Answer:   Beatrice Lecher D [[id number]]    There are no discontinued medications.     Time spent on visit including pre-visit chart review and post-visit care was 15 minutes.      The [place] was signed into law in 2016 which includes the topic of electronic health records.  This provides immediate access to information in MyChart.  This includes consultation notes, operative notes, office notes, lab results and pathology reports.  If you have any questions about what you read please let us know at your next visit or call us at the office.  We are right here with you.   __________________________________________________________________________________     Patient Care Team    Relationship Specialty Notifications Start End  Lorrene Reid, [place] PCP - General Physician Assistant  06/10/20   Kem Boroughs, [place] Nurse Practitioner Nurse Practitioner  08/25/11   Carolan Clines, MD (Inactive) Attending Physician Urology  08/25/11   Burnis Medin, MD Consulting Physician Internal Medicine  01/30/19   [name], MD Consulting Physician Gynecology  02/14/19      -Vitals obtained; medications/ allergies reconciled;  personal medical, social, Sx etc.histories were updated by CMA, reviewed by me and are reflected in chart   Patient Active Problem List   Diagnosis Date Noted  . Hormone replacement therapy (HRT) 03/29/2020  . Menopausal symptoms 03/29/2020  . Healthcare maintenance 01/30/2019  . Hypertrophy of tonsils alone 01/30/2019  . Staphylococcus aureus infection 03/14/2014  . Nausea alone 12/27/2013  . Hx of urinary stone 12/27/2013  . Internal/external hemorrhoids with bleeding 01/30/2012  . Neuroma of hand 12/01/2011  . Sleep disorder 08/25/2011  . Fatigue 08/25/2011  . Moodiness 08/25/2011  . UNSPEC LOCAL INFECTION SKIN&SUBCUTANEOUS TISSUE 04/27/2010  . NEPHROLITHIASIS, HX OF 12/24/2008  . HERPES SIMPLEX INFECTION, RECURRENT  01/29/2007  . HEMATURIA 01/29/2007     Current Meds  Medication Sig  . amoxicillin-clavulanate (AUGMENTIN) 875-125 MG tablet Take 1 tablet by mouth 2 (two) times daily.  Marland Kitchen estradiol (VIVELLE-DOT) 0.05 MG/24HR patch Place 1 patch (0.05 mg total) onto the skin 2 (two) times a week.  . medroxyPROGESTERone (PROVERA) 5 MG tablet Take 1 tab orally day 1-15 each month.  . Multiple Vitamin (MULTIVITAMIN) tablet Take 1 tablet by mouth daily.  Marland Kitchen Phenylephrine-DM-GG-APAP (MUCINEX SINUS-MAX) 5-10-200-325 MG CAPS Take by mouth.  . valACYclovir (VALTREX) 1000 MG tablet TAKE 2 TABLETS BY MOUTH TWICE DAILY FOR 1 DAY FOR FEVER BLISTER.     Allergies:  Allergies  Allergen Reactions  . Bee Venom Other (See Comments)    Patient receives antibiotic cream for infection due to sting  . Sulfonamide Derivatives Rash     ROS:  See above HPI for pertinent positives and negatives   Objective:   Height 5\' 4"  (1.626 m), weight 157 lb (71.2 kg).  (if some vitals are omitted, this means that patient was UNABLE to obtain them even.) General: A & O * 3; sounds in no acute distress Respiratory: speaking in full sentences, no conversational dyspnea Psych: insight  appears good, mood- appears full

## 2020-06-19 ENCOUNTER — Other Ambulatory Visit: Payer: Self-pay

## 2020-06-19 ENCOUNTER — Other Ambulatory Visit: Payer: Self-pay | Admitting: Orthopedic Surgery

## 2020-06-19 ENCOUNTER — Encounter (HOSPITAL_BASED_OUTPATIENT_CLINIC_OR_DEPARTMENT_OTHER): Payer: Self-pay | Admitting: Orthopedic Surgery

## 2020-06-19 ENCOUNTER — Other Ambulatory Visit (HOSPITAL_COMMUNITY)
Admission: RE | Admit: 2020-06-19 | Discharge: 2020-06-19 | Disposition: A | Discharge status: Home / Self Care | Payer: BC Managed Care – PPO | Source: Ambulatory Visit | Attending: Orthopedic Surgery | Admitting: Orthopedic Surgery

## 2020-06-19 DIAGNOSIS — S52571A Other intraarticular fracture of lower end of right radius, initial encounter for closed fracture: Secondary | ICD-10-CM | POA: Diagnosis not present

## 2020-06-19 DIAGNOSIS — Z20822 Contact with and (suspected) exposure to covid-19: Secondary | ICD-10-CM | POA: Insufficient documentation

## 2020-06-19 DIAGNOSIS — Z01812 Encounter for preprocedural laboratory examination: Secondary | ICD-10-CM | POA: Diagnosis not present

## 2020-06-19 DIAGNOSIS — M25531 Pain in right wrist: Secondary | ICD-10-CM | POA: Diagnosis not present

## 2020-06-19 DIAGNOSIS — S52501A Unspecified fracture of the lower end of right radius, initial encounter for closed fracture: Secondary | ICD-10-CM | POA: Insufficient documentation

## 2020-06-19 LAB — SARS CORONAVIRUS 2 (TAT 6-24 HRS): SARS Coronavirus 2: NEGATIVE

## 2020-06-23 ENCOUNTER — Encounter (HOSPITAL_BASED_OUTPATIENT_CLINIC_OR_DEPARTMENT_OTHER): Payer: Self-pay | Admitting: Orthopedic Surgery

## 2020-06-23 ENCOUNTER — Other Ambulatory Visit: Payer: Self-pay

## 2020-06-23 ENCOUNTER — Encounter (HOSPITAL_BASED_OUTPATIENT_CLINIC_OR_DEPARTMENT_OTHER)
Admission: RE | Disposition: A | Discharge status: Home / Self Care | Payer: Self-pay | Source: Home / Self Care | Attending: Orthopedic Surgery

## 2020-06-23 ENCOUNTER — Ambulatory Visit (HOSPITAL_BASED_OUTPATIENT_CLINIC_OR_DEPARTMENT_OTHER): Payer: BC Managed Care – PPO | Admitting: Anesthesiology

## 2020-06-23 ENCOUNTER — Ambulatory Visit (HOSPITAL_BASED_OUTPATIENT_CLINIC_OR_DEPARTMENT_OTHER)
Admission: RE | Admit: 2020-06-23 | Discharge: 2020-06-23 | Disposition: A | Discharge status: Home / Self Care | Payer: BC Managed Care – PPO | Attending: Orthopedic Surgery | Admitting: Orthopedic Surgery

## 2020-06-23 DIAGNOSIS — W19XXXA Unspecified fall, initial encounter: Secondary | ICD-10-CM | POA: Insufficient documentation

## 2020-06-23 DIAGNOSIS — Z91030 Bee allergy status: Secondary | ICD-10-CM | POA: Insufficient documentation

## 2020-06-23 DIAGNOSIS — S52571A Other intraarticular fracture of lower end of right radius, initial encounter for closed fracture: Secondary | ICD-10-CM | POA: Diagnosis not present

## 2020-06-23 DIAGNOSIS — Z87891 Personal history of nicotine dependence: Secondary | ICD-10-CM | POA: Diagnosis not present

## 2020-06-23 DIAGNOSIS — D649 Anemia, unspecified: Secondary | ICD-10-CM | POA: Diagnosis not present

## 2020-06-23 DIAGNOSIS — Z882 Allergy status to sulfonamides status: Secondary | ICD-10-CM | POA: Insufficient documentation

## 2020-06-23 DIAGNOSIS — Z79899 Other long term (current) drug therapy: Secondary | ICD-10-CM | POA: Insufficient documentation

## 2020-06-23 DIAGNOSIS — Z79890 Hormone replacement therapy (postmenopausal): Secondary | ICD-10-CM | POA: Diagnosis not present

## 2020-06-23 HISTORY — PX: OPEN REDUCTION INTERNAL FIXATION (ORIF) DISTAL RADIAL FRACTURE: SHX5989

## 2020-06-23 SURGERY — OPEN REDUCTION INTERNAL FIXATION (ORIF) DISTAL RADIUS FRACTURE
Anesthesia: Monitor Anesthesia Care | Site: Wrist | Laterality: Right

## 2020-06-23 MED ORDER — LIDOCAINE 2% (20 MG/ML) 5 ML SYRINGE
INTRAMUSCULAR | Status: DC | PRN
  Administered 2020-06-23: 50 mg via INTRAVENOUS

## 2020-06-23 MED ORDER — FENTANYL CITRATE (PF) 100 MCG/2ML IJ SOLN
INTRAMUSCULAR | Status: AC
  Filled 2020-06-23: qty 2

## 2020-06-23 MED ORDER — ONDANSETRON HCL 4 MG/2ML IJ SOLN
INTRAMUSCULAR | Status: AC
  Filled 2020-06-23: qty 2

## 2020-06-23 MED ORDER — BUPIVACAINE-EPINEPHRINE (PF) 0.5% -1:200000 IJ SOLN
INTRAMUSCULAR | Status: DC | PRN
  Administered 2020-06-23: 30 mL via PERINEURAL

## 2020-06-23 MED ORDER — HYDROCODONE-ACETAMINOPHEN 5-325 MG PO TABS: ORAL_TABLET | ORAL | 0 refills | Status: DC

## 2020-06-23 MED ORDER — MIDAZOLAM HCL 2 MG/2ML IJ SOLN
2.0000 mg | Freq: Once | INTRAMUSCULAR | Status: AC
  Administered 2020-06-23: 2 mg via INTRAVENOUS

## 2020-06-23 MED ORDER — MIDAZOLAM HCL 5 MG/5ML IJ SOLN
INTRAMUSCULAR | Status: DC | PRN
  Administered 2020-06-23: 2 mg via INTRAVENOUS

## 2020-06-23 MED ORDER — CEFAZOLIN SODIUM-DEXTROSE 2-4 GM/100ML-% IV SOLN
2.0000 g | INTRAVENOUS | Status: AC
  Administered 2020-06-23: 2 g via INTRAVENOUS

## 2020-06-23 MED ORDER — SUCCINYLCHOLINE CHLORIDE 200 MG/10ML IV SOSY
PREFILLED_SYRINGE | INTRAVENOUS | Status: AC
  Filled 2020-06-23: qty 10

## 2020-06-23 MED ORDER — MIDAZOLAM HCL 2 MG/2ML IJ SOLN
INTRAMUSCULAR | Status: AC
  Filled 2020-06-23: qty 2

## 2020-06-23 MED ORDER — PROPOFOL 500 MG/50ML IV EMUL
INTRAVENOUS | Status: DC | PRN
  Administered 2020-06-23: 100 ug/kg/min via INTRAVENOUS

## 2020-06-23 MED ORDER — LACTATED RINGERS IV SOLN: INTRAVENOUS | Status: DC

## 2020-06-23 MED ORDER — EPHEDRINE 5 MG/ML INJ
INTRAVENOUS | Status: AC
  Filled 2020-06-23: qty 10

## 2020-06-23 MED ORDER — CEFAZOLIN SODIUM-DEXTROSE 2-4 GM/100ML-% IV SOLN
INTRAVENOUS | Status: AC
  Filled 2020-06-23: qty 100

## 2020-06-23 MED ORDER — FENTANYL CITRATE (PF) 100 MCG/2ML IJ SOLN
100.0000 ug | Freq: Once | INTRAMUSCULAR | Status: AC
  Administered 2020-06-23: 50 ug via INTRAVENOUS

## 2020-06-23 MED ORDER — PHENYLEPHRINE 40 MCG/ML (10ML) SYRINGE FOR IV PUSH (FOR BLOOD PRESSURE SUPPORT)
PREFILLED_SYRINGE | INTRAVENOUS | Status: AC
  Filled 2020-06-23: qty 10

## 2020-06-23 MED ORDER — LIDOCAINE 2% (20 MG/ML) 5 ML SYRINGE
INTRAMUSCULAR | Status: AC
  Filled 2020-06-23: qty 5

## 2020-06-23 SURGICAL SUPPLY — 61 items
APL PRP STRL LF DISP 70% ISPRP (MISCELLANEOUS) ×1
BIT DRILL 2.0 LNG QUCK RELEASE (BIT) ×1 IMPLANT
BIT DRILL 2.8 QUICK RELEASE (BIT) ×1 IMPLANT
BLADE SURG 15 STRL LF DISP TIS (BLADE) ×2 IMPLANT
BLADE SURG 15 STRL SS (BLADE) ×4
BNDG CMPR 9X4 STRL LF SNTH (GAUZE/BANDAGES/DRESSINGS) ×1
BNDG ELASTIC 3X5.8 VLCR STR LF (GAUZE/BANDAGES/DRESSINGS) ×2 IMPLANT
BNDG ESMARK 4X9 LF (GAUZE/BANDAGES/DRESSINGS) ×2 IMPLANT
BNDG GAUZE ELAST 4 BULKY (GAUZE/BANDAGES/DRESSINGS) ×2 IMPLANT
BNDG PLASTER X FAST 3X3 WHT LF (CAST SUPPLIES) ×20 IMPLANT
BNDG PLSTR 9X3 FST ST WHT (CAST SUPPLIES) ×10
CHLORAPREP W/TINT 26 (MISCELLANEOUS) ×2 IMPLANT
CORD BIPOLAR FORCEPS 12FT (ELECTRODE) ×2 IMPLANT
COVER BACK TABLE 60X90IN (DRAPES) ×2 IMPLANT
COVER MAYO STAND STRL (DRAPES) ×2 IMPLANT
COVER WAND RF STERILE (DRAPES) IMPLANT
CUFF TOURN SGL QUICK 18X4 (TOURNIQUET CUFF) ×2 IMPLANT
CUFF TOURN SGL QUICK 24 (TOURNIQUET CUFF)
CUFF TRNQT CYL 24X4X16.5-23 (TOURNIQUET CUFF) IMPLANT
DRAPE EXTREMITY T 121X128X90 (DISPOSABLE) ×2 IMPLANT
DRAPE OEC MINIVIEW 54X84 (DRAPES) ×2 IMPLANT
DRAPE SURG 17X23 STRL (DRAPES) ×2 IMPLANT
DRILL 2.0 LNG QUICK RELEASE (BIT) ×2
DRILL 2.8 QUICK RELEASE (BIT) ×2
GAUZE SPONGE 4X4 12PLY STRL (GAUZE/BANDAGES/DRESSINGS) ×2 IMPLANT
GAUZE XEROFORM 1X8 LF (GAUZE/BANDAGES/DRESSINGS) ×2 IMPLANT
GLOVE BIO SURGEON STRL SZ7.5 (GLOVE) ×2 IMPLANT
GLOVE BIOGEL PI IND STRL 8.5 (GLOVE) ×1 IMPLANT
GLOVE BIOGEL PI INDICATOR 8.5 (GLOVE) ×1
GLOVE SRG 8 PF TXTR STRL LF DI (GLOVE) ×1 IMPLANT
GLOVE SURG ORTHO LTX SZ8 (GLOVE) ×2 IMPLANT
GLOVE SURG SS PI 8.0 STRL IVOR (GLOVE) ×2 IMPLANT
GLOVE SURG UNDER POLY LF SZ7 (GLOVE) ×2 IMPLANT
GLOVE SURG UNDER POLY LF SZ8 (GLOVE) ×2
GOWN STRL REUS W/ TWL LRG LVL3 (GOWN DISPOSABLE) ×1 IMPLANT
GOWN STRL REUS W/TWL 2XL LVL3 (GOWN DISPOSABLE) ×2 IMPLANT
GOWN STRL REUS W/TWL LRG LVL3 (GOWN DISPOSABLE) ×2
GOWN STRL REUS W/TWL XL LVL3 (GOWN DISPOSABLE) ×4 IMPLANT
GUIDEWIRE ORTHO 0.054X6 (WIRE) ×6 IMPLANT
NEEDLE HYPO 25X1 1.5 SAFETY (NEEDLE) IMPLANT
NS IRRIG 1000ML POUR BTL (IV SOLUTION) ×2 IMPLANT
PACK BASIN DAY SURGERY FS (CUSTOM PROCEDURE TRAY) ×2 IMPLANT
PAD CAST 3X4 CTTN HI CHSV (CAST SUPPLIES) ×1 IMPLANT
PADDING CAST COTTON 3X4 STRL (CAST SUPPLIES) ×2
PLATE PROXIMAL VDU ACULOC (Plate) ×2 IMPLANT
SCREW ACTK 2 NL HEX 3.5.11 (Screw) ×2 IMPLANT
SCREW CORT FT 18X2.3XLCK HEX (Screw) ×2 IMPLANT
SCREW CORTICAL LOCKING 2.3X18M (Screw) ×10 IMPLANT
SCREW CORTICAL LOCKING 2.3X20M (Screw) ×2 IMPLANT
SCREW FX18X2.3XSMTH LCK NS CRT (Screw) ×3 IMPLANT
SCREW FX20X2.3XSMTH LCK NS CRT (Screw) ×1 IMPLANT
SCREW HEXALOBE NON-LOCK 3.5X14 (Screw) ×2 IMPLANT
SCREW NONLOCK HEX 3.5X12 (Screw) ×2 IMPLANT
SLEEVE SCD COMPRESS KNEE MED (MISCELLANEOUS) IMPLANT
STOCKINETTE 4X48 STRL (DRAPES) ×2 IMPLANT
SUT ETHILON 4 0 PS 2 18 (SUTURE) ×4 IMPLANT
SUT VICRYL 4-0 PS2 18IN ABS (SUTURE) ×2 IMPLANT
SYR BULB EAR ULCER 3OZ GRN STR (SYRINGE) ×2 IMPLANT
SYR CONTROL 10ML LL (SYRINGE) IMPLANT
TOWEL GREEN STERILE FF (TOWEL DISPOSABLE) ×4 IMPLANT
UNDERPAD 30X36 HEAVY ABSORB (UNDERPADS AND DIAPERS) ×2 IMPLANT

## 2020-06-23 NOTE — Progress Notes (Signed)
Assisted Dr. Foster with right, ultrasound guided, supraclavicular block. Side rails up, monitors on throughout procedure. See vital signs in flow sheet. Tolerated Procedure well. °

## 2020-06-23 NOTE — Discharge Instructions (Addendum)
[facility] Instructions °Hand Surgery ° °Wound Care: °Keep your hand elevated above the level of your heart.  Do not allow it to dangle by your side.  Keep the dressing dry and do not remove it unless your doctor advises you to do so.  He will usually change it at the time of your post-op visit.  Moving your fingers is advised to stimulate circulation but will depend on the site of your surgery.  If you have a splint applied, your doctor will advise you regarding movement. ° °Activity: °Do not drive or operate machinery today.  Rest today and then you may return to your normal activity and work as indicated by your physician. ° °Diet:  °Drink liquids today or eat a light diet.  You may resume a regular diet tomorrow.   ° °General expectations: °Pain for two to three days. °Fingers may become slightly swollen. ° °Call your doctor if any of the following occur: °Severe pain not relieved by pain medication. °Elevated temperature. °Dressing soaked with blood. °Inability to move fingers. °White or bluish color to fingers. ° ° °Post Anesthesia Home Care Instructions ° °Activity: °Get plenty of rest for the remainder of the day. A responsible individual must stay with you for 24 hours following the procedure.  °For the next 24 hours, DO NOT: °-Drive a car °-Operate machinery °-Drink alcoholic beverages °-Take any medication unless instructed by your physician °-Make any legal decisions or sign important papers. ° °Meals: °Start with liquid foods such as gelatin or soup. Progress to regular foods as tolerated. Avoid greasy, spicy, heavy foods. If nausea and/or vomiting occur, drink only clear liquids until the nausea and/or vomiting subsides. Call your physician if vomiting continues. ° °Special Instructions/Symptoms: °Your throat may feel dry or sore from the anesthesia or the breathing tube placed in your throat during surgery. If this causes discomfort, gargle with warm salt water. The discomfort should disappear within  24 hours. ° °If you had a scopolamine patch placed behind your ear for the management of post- operative nausea and/or vomiting: ° °1. The medication in the patch is effective for 72 hours, after which it should be removed.  Wrap patch in a tissue and discard in the trash. Wash hands thoroughly with soap and water. °2. You may remove the patch earlier than 72 hours if you experience unpleasant side effects which may include dry mouth, dizziness or visual disturbances. °3. Avoid touching the patch. Wash your hands with soap and water after contact with the patch. °  ° °Post Anesthesia Home Care Instructions ° °Activity: °Get plenty of rest for the remainder of the day. A responsible individual must stay with you for 24 hours following the procedure.  °For the next 24 hours, DO NOT: °-Drive a car °-Operate machinery °-Drink alcoholic beverages °-Take any medication unless instructed by your physician °-Make any legal decisions or sign important papers. ° °Meals: °Start with liquid foods such as gelatin or soup. Progress to regular foods as tolerated. Avoid greasy, spicy, heavy foods. If nausea and/or vomiting occur, drink only clear liquids until the nausea and/or vomiting subsides. Call your physician if vomiting continues. ° °Special Instructions/Symptoms: °Your throat may feel dry or sore from the anesthesia or the breathing tube placed in your throat during surgery. If this causes discomfort, gargle with warm salt water. The discomfort should disappear within 24 hours. ° °If you had a scopolamine patch placed behind your ear for the management of post- operative nausea and/or vomiting: ° °  1. The medication in the patch is effective for 72 hours, after which it should be removed.  Wrap patch in a tissue and discard in the trash. Wash hands thoroughly with soap and water. °2. You may remove the patch earlier than 72 hours if you experience unpleasant side effects which may include dry mouth, dizziness or visual  disturbances. °3. Avoid touching the patch. Wash your hands with soap and water after contact with the patch. °  ° °

## 2020-06-23 NOTE — Anesthesia Procedure Notes (Signed)
Anesthesia Regional Block: Supraclavicular block   Pre-Anesthetic Checklist: ,, timeout performed, Correct Patient, Correct Site, Correct Laterality, Correct Procedure, Correct Position, site marked, Risks and benefits discussed,  Surgical consent,  Pre-op evaluation,  At surgeon's request and post-op pain management  Laterality: Right  Prep: chloraprep       Needles:  Injection technique: Single-shot  Needle Type: Echogenic Stimulator Needle     Needle Length: 9cm  Needle Gauge: 21   Needle insertion depth: 6 cm   Additional Needles:   Procedures:,,,, ultrasound used (permanent image in chart),,,,  Motor weakness within 3 minutes.  Narrative:  Start time: 06/23/2020 1:50 PM End time: 06/23/2020 1:55 PM Injection made incrementally with aspirations every 5 mL.  Performed by: Personally  Anesthesiologist: Josephine Igo, MD  Additional Notes: Timeout performed. Patient sedated. Relevant anatomy ID'd using [place]. Incremental 2-62ml injection of LA with frequent aspiration. Patient tolerated procedure well. f       Right Supraclavicular Block

## 2020-06-23 NOTE — Anesthesia Postprocedure Evaluation (Signed)
Anesthesia Post Note  Patient: Meghan Frank  Procedure(s) Performed: OPEN REDUCTION INTERNAL FIXATION (ORIF) DISTAL RADIAL FRACTURE; BRACHIORADIALIS RELEASE (Right Wrist)     Patient location during evaluation: PACU Anesthesia Type: Regional Level of consciousness: awake and alert and oriented Pain management: pain level controlled Vital Signs Assessment: post-procedure vital signs reviewed and stable Respiratory status: spontaneous breathing, nonlabored ventilation and respiratory function stable Cardiovascular status: blood pressure returned to baseline Postop Assessment: no apparent nausea or vomiting Anesthetic complications: no   No complications documented.  Last Vitals:  Vitals:   06/23/20 1547 06/23/20 1600  BP: (!) 145/67 (!) 146/67  Pulse: 98 (!) 101  Resp:  (!) 22  Temp: (!) 36.3 C   SpO2: 95% 98%    Last Pain:  Vitals:   06/23/20 1600  TempSrc:   PainSc: 0-No pain                 Brennan Bailey

## 2020-06-23 NOTE — Anesthesia Preprocedure Evaluation (Addendum)
Anesthesia Evaluation  Patient identified by MRN, date of birth, ID band Patient awake    Reviewed: Allergy & Precautions, NPO status , Patient's Chart, lab work & pertinent test results  History of Anesthesia Complications (+) PONV and history of anesthetic complications  Airway Mallampati: II  TM Distance: >3 FB Neck ROM: Full    Dental no notable dental hx. (+) Teeth Intact   Pulmonary former smoker,    Pulmonary exam normal breath sounds clear to auscultation       Cardiovascular negative cardio ROS Normal cardiovascular exam Rhythm:Regular Rate:Normal     Neuro/Psych negative neurological ROS  negative psych ROS   GI/Hepatic negative GI ROS, Neg liver ROS,   Endo/Other  negative endocrine ROS  Renal/GU Renal diseaseHx/o nephrolithiasis  negative genitourinary   Musculoskeletal Fx right distal radius   Abdominal   Peds  Hematology  (+) anemia ,   Anesthesia Other Findings   Reproductive/Obstetrics HSV                             Anesthesia Physical Anesthesia Plan  ASA: II  Anesthesia Plan: Regional and MAC   Post-op Pain Management:  Regional for Post-op pain   Induction: Intravenous  PONV Risk Score and Plan: 4 or greater and Scopolamine patch - Pre-op, Midazolam, Ondansetron, Treatment may vary due to age or medical condition and Propofol infusion  Airway Management Planned: Natural Airway, Simple Face Mask and Nasal Cannula  Additional Equipment:   Intra-op Plan:   Post-operative Plan:   Informed Consent: I have reviewed the patients History and Physical, chart, labs and discussed the procedure including the risks, benefits and alternatives for the proposed anesthesia with the patient or authorized representative who has indicated his/her understanding and acceptance.     Dental advisory given  Plan Discussed with: CRNA and Anesthesiologist  Anesthesia Plan  Comments:        Anesthesia Quick Evaluation

## 2020-06-23 NOTE — Op Note (Signed)
I assisted Surgeon(s) and Role:    * Leanora Cover, MD - Primary    Daryll Brod, MD on the Procedure(s): OPEN REDUCTION INTERNAL FIXATION (ORIF) DISTAL RADIAL FRACTURE; BRACHIORADIALS RELEASE on 06/23/2020.  I provided assistance on this case as follows:  set up, approach, notification of the fracture isolation of the fracture, use of the brachial radialis, reduction and stabilization of the fracture, fixation of the fracture with plate and screws, closure of the wound, and application of dressing and splint.  Electronically signed by: Daryll Brod, MD Date: 06/23/2020 Time: 3:46 PM

## 2020-06-23 NOTE — Op Note (Signed)
06/23/2020 [facility]  Operative Note  Pre Op Diagnosis: Right comminuted intraarticular distal radius fracture  Post Op Diagnosis: Right comminuted intraarticular distal radius fracture  Procedure:  1. ORIF Right comminuted intraarticular distal radius fracture, 3 intraarticular fragments 2. Right brachioradialis release  Surgeon: Leanora Cover, MD  Assistant: Daryll Brod, MD  Anesthesia: Regional with sedation  Fluids: Per anesthesia flow sheet  EBL: minimal  Complications: None  Specimen: None  Tourniquet Time:  Total Tourniquet Time Documented: Upper Arm (Right) - 40 minutes Total: Upper Arm (Right) - 40 minutes   Disposition: Stable to PACU  INDICATIONS:  Meghan Frank is a 55 y.o. female states she fell 06/19/20 injuring right wrist.  XR revealed distal radius fracture.  We discussed nonoperative and operative treatment options.  She wished to proceed with operative fixation.  Risks, benefits, and alternatives of surgery were discussed including the risk of blood loss; infection; damage to nerves, vessels, tendons, ligaments, bone; failure of surgery; need for additional surgery; complications with wound healing; continued pain; nonunion; malunion; stiffness.  We also discussed the possible need for bone graft and the benefits and risks including the possibility of disease transmission.  She voiced understanding of these risks and elected to proceed.    OPERATIVE COURSE:  After being identified preoperatively by myself, the patient and I agreed upon the procedure and site of procedure.  Surgical site was marked.   Surgical consent had been signed.  She was given IV Ancef as preoperative antibiotic prophylaxis.  She was transferred to the operating room and placed on the operating room table in supine position with the Right upper extremity on an armboard. Sedation was induced by the anesthesiologist.  A regional block had been performed by anesthesia in  preoperative holding.  The Right upper extremity was prepped and draped in normal sterile orthopedic fashion.  A surgical pause was performed between the surgeons, anesthesia and operating room staff, and all were in agreement as to the patient, procedure and site of procedure.  Tourniquet at the proximal aspect of the extremity was inflated to 250 mmHg after exsanguination of the limb with an Esmarch bandage.  Standard volar Mallie Mussel approach was used.  The bipolar electrocautery was used to obtain hemostasis.  The superficial and deep portions of the FCR tendon sheath were incised, and the FCR and FPL were swept ulnarly to protect the palmar cutaneous branch of the median nerve.  The brachioradialis was released at the radial side of the radius.  The pronator quadratus was released and elevated with the periosteal elevator.  The fracture site was identified and cleared of soft tissue interposition and hematoma.  It was reduced under direct visualization.  There was intraarticular extension creating three intraarticular fragments.  An AcuMed volar distal radial locking plate was selected.  It was secured to the bone with the guidepins.  C-arm was used in AP and lateral projections to ensure appropriate reduction and position of the hardware and adjustments made as necessary.  Standard AO drilling and measuring technique was used.  A single screw was placed in the slotted hole in the shaft of the plate.  The distal holes were filled with locking pegs with the exception of the styloid holes, which were filled with locking screws.  The remaining holes in the shaft of the plate were filled with nonlocking screws.  Good purchase was obtained.  C-arm was used in AP, lateral and oblique projections to ensure appropriate reduction and position of hardware, which  was the case.  There was no intra-articular penetration of hardware.  The wound was copiously irrigated with sterile saline.  Pronator quadratus was repaired back  over top of the plate using 4-0 Vicryl suture.  Vicryl suture was placed in the subcutaneous tissues in an inverted interrupted fashion and the skin was closed with 4-0 nylon in a horizontal mattress fashion.  There was good pronation and supination of the wrist without crepitance.  The wound was then dressed with sterile Xeroform, 4x4s, and wrapped with a Kerlix bandage.  A volar splint was placed and wrapped with Kerlix and Ace bandage.  Tourniquet was deflated at 40 minutes.  Fingertips were pink with brisk capillary refill after deflation of the tourniquet.  Operative drapes were broken down.  The patient was awoken from anesthesia safely.  She was transferred back to the stretcher and taken to the PACU in stable condition.  I will see her back in the office in one week for postoperative followup.  I will give her a prescription for Norco 5/325 1-2 tabs PO q6 hours prn pain, dispense # 20.    Leanora Cover, MD Electronically signed, 06/23/20

## 2020-06-23 NOTE — Transfer of Care (Signed)
Immediate Anesthesia Transfer of Care Note  Patient: Meghan Frank  Procedure(s) Performed: OPEN REDUCTION INTERNAL FIXATION (ORIF) DISTAL RADIAL FRACTURE; BRACHIORADIALS RELEASE (Right Wrist)  Patient Location: PACU  Anesthesia Type:MAC combined with regional for post-op pain  Level of Consciousness: awake, alert , oriented and patient cooperative  Airway & Oxygen Therapy: Patient Spontanous Breathing and Patient connected to face mask oxygen  Post-op Assessment: Report given to RN and Post -op Vital signs reviewed and stable  Post vital signs: Reviewed and stable  Last Vitals:  Vitals Value Taken Time  BP    Temp    Pulse 85 06/23/20 1547  Resp 13 06/23/20 1547  SpO2 98 % 06/23/20 1547  Vitals shown include unvalidated device data.  Last Pain:  Vitals:   06/23/20 1156  TempSrc: Oral  PainSc: 3       Patients Stated Pain Goal: 3 (70/96/43 8381)  Complications: No complications documented.

## 2020-06-23 NOTE — H&P (Signed)
Meghan Frank is an 55 y.o. female.   Chief Complaint: wrist fracture HPI: 55 yo female states she fell 06/19/20 injuring right wrist.  XR revealed right distal radius fracture.  She wishes to proceed with operative fixation.  Allergies:  Allergies  Allergen Reactions  . Bee Venom Other (See Comments)    Patient receives antibiotic cream for infection due to sting  . Sulfonamide Derivatives Rash    Past Medical History:  Diagnosis Date  . Abnormal Pap smear of cervix about 2005   colpo biopsy was benign  . Anemia   . Complication of anesthesia    "doesn't wake up well"  . Elevated bilirubin    declines evaluation.  . Gallstone   . Internal/external hemorrhoids with bleeding 01/30/2012  . PONV (postoperative nausea and vomiting)   . Recurrent herpes labialis   . Recurrent nephrolithiasis     Past Surgical History:  Procedure Laterality Date  . CHOLECYSTECTOMY  2008  . COLONOSCOPY  01/30/2012   Procedure: COLONOSCOPY;  Surgeon: Gatha Mayer, MD;  Location: [facility];  Service: Endoscopy;  Laterality: N/A;  . CYSTOSCOPY/RETROGRADE/URETEROSCOPY/STONE EXTRACTION WITH BASKET  04/29/2011   Procedure: CYSTOSCOPY/RETROGRADE/URETEROSCOPY/STONE EXTRACTION WITH BASKET;  Surgeon: Ailene Rud, MD;  Location: [facility];  Service: Urology;  Laterality: Bilateral;  bilateral retrogrades, bilateral ureteroscopy, left stone basketry  . HEMORRHOID SURGERY  2007  . LASER ABLATION  2010   uterine ablation  . LITHOTRIPSY  2008  . MANDIBLE SURGERY  1985  . MASS EXCISION  11/25/2011   right hand-neuroma removed  . TUBAL LIGATION  2000    Family History: Family History  Problem Relation Age of Onset  . Coronary artery disease Father   . Diabetes Father   . Hyperlipidemia Father   . Colon polyps Father   . Diverticulosis Father   . Heart attack Father   . Coronary artery disease Paternal Grandmother   . Coronary artery disease Maternal Grandmother   . Colon cancer Maternal  Grandfather   . Alcohol abuse Maternal Grandfather   . Lung cancer Paternal Grandfather   . Hypertension Sister     Social History:   reports that she quit smoking about 9 years ago. Her smoking use included cigarettes. She smoked 0.50 packs per day for 0.00 years. She has never used smokeless tobacco. She reports current alcohol use. She reports that she does not use drugs.  Medications: Medications Prior to Admission  Medication Sig Dispense Refill  . estradiol (VIVELLE-DOT) 0.05 MG/24HR patch Place 1 patch (0.05 mg total) onto the skin 2 (two) times a week. 24 patch 1  . medroxyPROGESTERone (PROVERA) 5 MG tablet Take 1 tab orally day 1-15 each month. 45 tablet 1  . Multiple Vitamin (MULTIVITAMIN) tablet Take 1 tablet by mouth daily.    . valACYclovir (VALTREX) 1000 MG tablet TAKE 2 TABLETS BY MOUTH TWICE DAILY FOR 1 DAY FOR FEVER BLISTER. 30 tablet 6  . Phenylephrine-DM-GG-APAP (MUCINEX SINUS-MAX) 5-10-200-325 MG CAPS Take by mouth.      No results found for this or any previous visit (from the past 48 hour(s)).  No results found.   A comprehensive review of systems was negative.  Blood pressure 128/87, pulse 82, temperature 98.8 F (37.1 C), temperature source Oral, resp. rate 14, height 5\' 4"  (1.626 m), weight 72.5 kg, SpO2 100 %.  General appearance: alert, cooperative and appears stated age Head: Normocephalic, without obvious abnormality, atraumatic Neck: supple, symmetrical, trachea midline Cardio: regular rate and rhythm Resp: clear  to auscultation bilaterally Extremities: Intact sensation and capillary refill all digits.  +epl/fpl/io.  No wounds.  Pulses: 2+ and symmetric Skin: Skin color, texture, turgor normal. No rashes or lesions Neurologic: Grossly normal Incision/Wound: none  Assessment/Plan Right distal radius fracture.  Non operative and operative treatment options have been discussed with the patient and patient wishes to proceed with operative treatment.  Risks, benefits, and alternatives of surgery have been discussed and the patient agrees with the plan of care.   Leanora Cover 06/23/2020, 12:42 PM

## 2020-06-24 ENCOUNTER — Encounter (HOSPITAL_BASED_OUTPATIENT_CLINIC_OR_DEPARTMENT_OTHER): Payer: Self-pay | Admitting: Orthopedic Surgery

## 2020-06-29 ENCOUNTER — Telehealth: Payer: Self-pay | Admitting: Physician Assistant

## 2020-06-29 NOTE — Telephone Encounter (Signed)
Left msg for patient to call back. AS, CMA 

## 2020-06-29 NOTE — Telephone Encounter (Signed)
Patient has tested positive for COVID and would like to know if she can be prescribed anything to help and or treat it. Patient uses [facility] if needed, thanks.

## 2020-06-30 NOTE — Telephone Encounter (Signed)
Spoke with patient who was requesting antiviral medication for covid. Patient states she has been on pain meds from operation so she isnt sure what date she became symptomatic but she thinks it was Friday. Patient did a rapid covid home test over the weekend that was positive.  I advised pt we were not prescribing antiviral medications in our office and that we were just advising otc symptom management to patient with non severe symptoms. Patient states her symptoms are not severe. Patient denies SOB or difficulty breathing. Patient given information to contact covid care on demand at [email or web address] Patient was very upset with the fact that we would not prescribe antiviral medications. Herb Grays is aware of the above. AS, CMA

## 2020-07-03 DIAGNOSIS — S52571D Other intraarticular fracture of lower end of right radius, subsequent encounter for closed fracture with routine healing: Secondary | ICD-10-CM | POA: Diagnosis not present

## 2020-07-03 DIAGNOSIS — M25531 Pain in right wrist: Secondary | ICD-10-CM | POA: Diagnosis not present

## 2020-07-03 DIAGNOSIS — M25631 Stiffness of right wrist, not elsewhere classified: Secondary | ICD-10-CM | POA: Diagnosis not present

## 2020-07-03 DIAGNOSIS — M25431 Effusion, right wrist: Secondary | ICD-10-CM | POA: Diagnosis not present

## 2020-07-07 DIAGNOSIS — Z0001 Encounter for general adult medical examination with abnormal findings: Secondary | ICD-10-CM | POA: Diagnosis not present

## 2020-07-07 DIAGNOSIS — Z6831 Body mass index (BMI) 31.0-31.9, adult: Secondary | ICD-10-CM | POA: Diagnosis not present

## 2020-07-07 DIAGNOSIS — E669 Obesity, unspecified: Secondary | ICD-10-CM | POA: Diagnosis not present

## 2020-07-07 DIAGNOSIS — M0580 Other rheumatoid arthritis with rheumatoid factor of unspecified site: Secondary | ICD-10-CM | POA: Diagnosis not present

## 2020-07-09 DIAGNOSIS — Z7182 Exercise counseling: Secondary | ICD-10-CM | POA: Diagnosis not present

## 2020-07-09 DIAGNOSIS — Z6831 Body mass index (BMI) 31.0-31.9, adult: Secondary | ICD-10-CM | POA: Diagnosis not present

## 2020-07-09 DIAGNOSIS — E669 Obesity, unspecified: Secondary | ICD-10-CM | POA: Diagnosis not present

## 2020-07-31 DIAGNOSIS — E669 Obesity, unspecified: Secondary | ICD-10-CM | POA: Diagnosis not present

## 2020-07-31 DIAGNOSIS — Z6830 Body mass index (BMI) 30.0-30.9, adult: Secondary | ICD-10-CM | POA: Diagnosis not present

## 2020-07-31 DIAGNOSIS — Z713 Dietary counseling and surveillance: Secondary | ICD-10-CM | POA: Diagnosis not present

## 2020-07-31 DIAGNOSIS — M0580 Other rheumatoid arthritis with rheumatoid factor of unspecified site: Secondary | ICD-10-CM | POA: Diagnosis not present

## 2020-08-07 DIAGNOSIS — S52571D Other intraarticular fracture of lower end of right radius, subsequent encounter for closed fracture with routine healing: Secondary | ICD-10-CM | POA: Diagnosis not present

## 2020-08-24 DIAGNOSIS — S52571D Other intraarticular fracture of lower end of right radius, subsequent encounter for closed fracture with routine healing: Secondary | ICD-10-CM | POA: Diagnosis not present

## 2020-08-24 DIAGNOSIS — M25631 Stiffness of right wrist, not elsewhere classified: Secondary | ICD-10-CM | POA: Diagnosis not present

## 2020-08-24 DIAGNOSIS — M25531 Pain in right wrist: Secondary | ICD-10-CM | POA: Diagnosis not present

## 2020-08-24 DIAGNOSIS — R29898 Other symptoms and signs involving the musculoskeletal system: Secondary | ICD-10-CM | POA: Diagnosis not present

## 2020-09-03 DIAGNOSIS — S52571D Other intraarticular fracture of lower end of right radius, subsequent encounter for closed fracture with routine healing: Secondary | ICD-10-CM | POA: Diagnosis not present

## 2020-09-03 DIAGNOSIS — R29898 Other symptoms and signs involving the musculoskeletal system: Secondary | ICD-10-CM | POA: Diagnosis not present

## 2020-09-03 DIAGNOSIS — M25631 Stiffness of right wrist, not elsewhere classified: Secondary | ICD-10-CM | POA: Diagnosis not present

## 2020-09-08 DIAGNOSIS — Z85828 Personal history of other malignant neoplasm of skin: Secondary | ICD-10-CM | POA: Diagnosis not present

## 2020-09-08 DIAGNOSIS — D2361 Other benign neoplasm of skin of right upper limb, including shoulder: Secondary | ICD-10-CM | POA: Diagnosis not present

## 2020-09-08 DIAGNOSIS — D225 Melanocytic nevi of trunk: Secondary | ICD-10-CM | POA: Diagnosis not present

## 2020-09-08 DIAGNOSIS — D2261 Melanocytic nevi of right upper limb, including shoulder: Secondary | ICD-10-CM | POA: Diagnosis not present

## 2020-09-16 DIAGNOSIS — S52571D Other intraarticular fracture of lower end of right radius, subsequent encounter for closed fracture with routine healing: Secondary | ICD-10-CM | POA: Diagnosis not present

## 2020-09-18 DIAGNOSIS — M25631 Stiffness of right wrist, not elsewhere classified: Secondary | ICD-10-CM | POA: Diagnosis not present

## 2020-09-18 DIAGNOSIS — M25531 Pain in right wrist: Secondary | ICD-10-CM | POA: Diagnosis not present

## 2020-09-18 DIAGNOSIS — R29898 Other symptoms and signs involving the musculoskeletal system: Secondary | ICD-10-CM | POA: Diagnosis not present

## 2020-09-18 DIAGNOSIS — S52571D Other intraarticular fracture of lower end of right radius, subsequent encounter for closed fracture with routine healing: Secondary | ICD-10-CM | POA: Diagnosis not present

## 2020-10-02 DIAGNOSIS — M25631 Stiffness of right wrist, not elsewhere classified: Secondary | ICD-10-CM | POA: Diagnosis not present

## 2020-10-02 DIAGNOSIS — S52571D Other intraarticular fracture of lower end of right radius, subsequent encounter for closed fracture with routine healing: Secondary | ICD-10-CM | POA: Diagnosis not present

## 2020-10-02 DIAGNOSIS — R29898 Other symptoms and signs involving the musculoskeletal system: Secondary | ICD-10-CM | POA: Diagnosis not present

## 2020-10-15 DIAGNOSIS — S52571D Other intraarticular fracture of lower end of right radius, subsequent encounter for closed fracture with routine healing: Secondary | ICD-10-CM | POA: Diagnosis not present

## 2020-10-15 DIAGNOSIS — R29898 Other symptoms and signs involving the musculoskeletal system: Secondary | ICD-10-CM | POA: Diagnosis not present

## 2020-10-15 DIAGNOSIS — M25631 Stiffness of right wrist, not elsewhere classified: Secondary | ICD-10-CM | POA: Diagnosis not present

## 2020-10-15 DIAGNOSIS — M25531 Pain in right wrist: Secondary | ICD-10-CM | POA: Diagnosis not present

## 2020-10-29 DIAGNOSIS — M25631 Stiffness of right wrist, not elsewhere classified: Secondary | ICD-10-CM | POA: Diagnosis not present

## 2020-10-29 DIAGNOSIS — M25531 Pain in right wrist: Secondary | ICD-10-CM | POA: Diagnosis not present

## 2020-11-04 ENCOUNTER — Other Ambulatory Visit (HOSPITAL_BASED_OUTPATIENT_CLINIC_OR_DEPARTMENT_OTHER): Payer: Self-pay | Admitting: *Deleted

## 2020-11-04 ENCOUNTER — Other Ambulatory Visit: Payer: Self-pay | Admitting: Obstetrics & Gynecology

## 2020-11-04 MED ORDER — MEDROXYPROGESTERONE ACETATE 5 MG PO TABS: ORAL_TABLET | ORAL | 1 refills | Status: DC

## 2020-11-04 NOTE — Telephone Encounter (Signed)
LMOVM for pt to call me back so I can verify need for refill

## 2020-11-05 DIAGNOSIS — M25531 Pain in right wrist: Secondary | ICD-10-CM | POA: Diagnosis not present

## 2020-11-05 DIAGNOSIS — M25631 Stiffness of right wrist, not elsewhere classified: Secondary | ICD-10-CM | POA: Diagnosis not present

## 2020-11-05 DIAGNOSIS — S52571D Other intraarticular fracture of lower end of right radius, subsequent encounter for closed fracture with routine healing: Secondary | ICD-10-CM | POA: Diagnosis not present

## 2020-11-05 DIAGNOSIS — R29898 Other symptoms and signs involving the musculoskeletal system: Secondary | ICD-10-CM | POA: Diagnosis not present

## 2020-11-12 DIAGNOSIS — M25631 Stiffness of right wrist, not elsewhere classified: Secondary | ICD-10-CM | POA: Diagnosis not present

## 2020-11-12 DIAGNOSIS — R29898 Other symptoms and signs involving the musculoskeletal system: Secondary | ICD-10-CM | POA: Diagnosis not present

## 2020-11-12 DIAGNOSIS — S52571D Other intraarticular fracture of lower end of right radius, subsequent encounter for closed fracture with routine healing: Secondary | ICD-10-CM | POA: Diagnosis not present

## 2020-11-12 DIAGNOSIS — M25531 Pain in right wrist: Secondary | ICD-10-CM | POA: Diagnosis not present

## 2020-11-19 DIAGNOSIS — R29898 Other symptoms and signs involving the musculoskeletal system: Secondary | ICD-10-CM | POA: Diagnosis not present

## 2020-11-19 DIAGNOSIS — S52571D Other intraarticular fracture of lower end of right radius, subsequent encounter for closed fracture with routine healing: Secondary | ICD-10-CM | POA: Diagnosis not present

## 2020-11-19 DIAGNOSIS — M25631 Stiffness of right wrist, not elsewhere classified: Secondary | ICD-10-CM | POA: Diagnosis not present

## 2020-11-25 DIAGNOSIS — R29898 Other symptoms and signs involving the musculoskeletal system: Secondary | ICD-10-CM | POA: Diagnosis not present

## 2020-11-25 DIAGNOSIS — M25531 Pain in right wrist: Secondary | ICD-10-CM | POA: Diagnosis not present

## 2020-11-25 DIAGNOSIS — S52571D Other intraarticular fracture of lower end of right radius, subsequent encounter for closed fracture with routine healing: Secondary | ICD-10-CM | POA: Diagnosis not present

## 2020-11-25 DIAGNOSIS — M25631 Stiffness of right wrist, not elsewhere classified: Secondary | ICD-10-CM | POA: Diagnosis not present

## 2021-01-05 DIAGNOSIS — Z6827 Body mass index (BMI) 27.0-27.9, adult: Secondary | ICD-10-CM | POA: Diagnosis not present

## 2021-01-05 DIAGNOSIS — M25551 Pain in right hip: Secondary | ICD-10-CM | POA: Diagnosis not present

## 2021-01-05 DIAGNOSIS — R030 Elevated blood-pressure reading, without diagnosis of hypertension: Secondary | ICD-10-CM | POA: Diagnosis not present

## 2021-02-18 ENCOUNTER — Ambulatory Visit (HOSPITAL_BASED_OUTPATIENT_CLINIC_OR_DEPARTMENT_OTHER): Payer: BC Managed Care – PPO | Admitting: Obstetrics & Gynecology

## 2021-03-08 ENCOUNTER — Other Ambulatory Visit: Payer: Self-pay | Admitting: Obstetrics & Gynecology

## 2021-03-22 ENCOUNTER — Other Ambulatory Visit (HOSPITAL_COMMUNITY)
Admission: RE | Admit: 2021-03-22 | Discharge: 2021-03-22 | Disposition: A | Discharge status: Home / Self Care | Payer: BC Managed Care – PPO | Source: Ambulatory Visit | Attending: Obstetrics & Gynecology | Admitting: Obstetrics & Gynecology

## 2021-03-22 ENCOUNTER — Encounter (HOSPITAL_BASED_OUTPATIENT_CLINIC_OR_DEPARTMENT_OTHER): Payer: Self-pay | Admitting: Obstetrics & Gynecology

## 2021-03-22 ENCOUNTER — Other Ambulatory Visit: Payer: Self-pay

## 2021-03-22 ENCOUNTER — Ambulatory Visit (INDEPENDENT_AMBULATORY_CARE_PROVIDER_SITE_OTHER): Payer: BC Managed Care – PPO | Admitting: Obstetrics & Gynecology

## 2021-03-22 VITALS — BP 122/73 | HR 69 | Ht 64.0 in | Wt 152.2 lb

## 2021-03-22 DIAGNOSIS — Z8619 Personal history of other infectious and parasitic diseases: Secondary | ICD-10-CM | POA: Diagnosis not present

## 2021-03-22 DIAGNOSIS — Z01419 Encounter for gynecological examination (general) (routine) without abnormal findings: Secondary | ICD-10-CM

## 2021-03-22 DIAGNOSIS — B977 Papillomavirus as the cause of diseases classified elsewhere: Secondary | ICD-10-CM | POA: Diagnosis not present

## 2021-03-22 DIAGNOSIS — Z124 Encounter for screening for malignant neoplasm of cervix: Secondary | ICD-10-CM

## 2021-03-22 DIAGNOSIS — Z79890 Hormone replacement therapy (postmenopausal): Secondary | ICD-10-CM

## 2021-03-22 DIAGNOSIS — Z23 Encounter for immunization: Secondary | ICD-10-CM

## 2021-03-22 MED ORDER — VALACYCLOVIR HCL 500 MG PO TABS: 500.0000 mg | ORAL_TABLET | Freq: Every day | ORAL | 3 refills | Status: DC

## 2021-03-22 MED ORDER — MEDROXYPROGESTERONE ACETATE 5 MG PO TABS: ORAL_TABLET | ORAL | 0 refills | Status: DC

## 2021-03-22 MED ORDER — ESTRADIOL 0.05 MG/24HR TD PTTW: 1.0000 | MEDICATED_PATCH | TRANSDERMAL | 2 refills | Status: DC

## 2021-03-22 NOTE — Progress Notes (Signed)
55 y.o. [id number] Married White or Caucasian female here for annual exam.  Doing well.  Fractured right radius.  Had to have surgery and has a plate with screws.  Has a grandson who is 77 months old.  This is her daughter's child.  Her son and his fiancee had a baby on August 31st.    Denies vaginal bleeding.  No LMP recorded. Patient has had an ablation.          Sexually active: Yes.    The current method of family planning is post menopausal status and BTL Exercising: Yes.     walking Smoker:  no  Health Maintenance: Pap:  02/17/2020 Negative History of abnormal Pap:  yes, h/o HPV MMG:  02/08/2019 Negative Colonoscopy:  01/30/2012 BMD:   not indicated Screening Labs: 02/2019   reports that she quit smoking about 9 years ago. Her smoking use included cigarettes. She smoked an average of 0.50 packs per day. She has never used smokeless tobacco. She reports current alcohol use. She reports that she does not use drugs.  Past Medical History:  Diagnosis Date   Abnormal Pap smear of cervix about 2005   colpo biopsy was benign   Anemia    Complication of anesthesia    "doesn't wake up well"   Elevated bilirubin    declines evaluation.   Gallstone    Internal/external hemorrhoids with bleeding 01/30/2012   PONV (postoperative nausea and vomiting)    Recurrent herpes labialis    Recurrent nephrolithiasis     Past Surgical History:  Procedure Laterality Date   CHOLECYSTECTOMY  2008   COLONOSCOPY  01/30/2012   Procedure: COLONOSCOPY;  Surgeon: Gatha Mayer, MD;  Location: [facility];  Service: Endoscopy;  Laterality: N/A;   CYSTOSCOPY/RETROGRADE/URETEROSCOPY/STONE EXTRACTION WITH BASKET  04/29/2011   Procedure: CYSTOSCOPY/RETROGRADE/URETEROSCOPY/STONE EXTRACTION WITH BASKET;  Surgeon: Ailene Rud, MD;  Location: [facility];  Service: Urology;  Laterality: Bilateral;  bilateral retrogrades, bilateral ureteroscopy, left stone basketry   HEMORRHOID SURGERY  2007   LASER ABLATION   2010   uterine ablation   LITHOTRIPSY  2008   [place]   MASS EXCISION  11/25/2011   right hand-neuroma removed   OPEN REDUCTION INTERNAL FIXATION (ORIF) DISTAL RADIAL FRACTURE Right 06/23/2020   Procedure: OPEN REDUCTION INTERNAL FIXATION (ORIF) DISTAL RADIAL FRACTURE; BRACHIORADIALIS RELEASE;  Surgeon: Leanora Cover, MD;  Location: [place];  Service: Orthopedics;  Laterality: Right;  regional block ([facility])   TUBAL LIGATION  2000    Current Outpatient Medications  Medication Sig Dispense Refill   Multiple Vitamin (MULTIVITAMIN) tablet Take 1 tablet by mouth daily.     Phenylephrine-DM-GG-APAP (MUCINEX SINUS-MAX) 5-10-200-325 MG CAPS Take by mouth.     valACYclovir (VALTREX) 1000 MG tablet TAKE 2 TABLETS BY MOUTH TWICE DAILY FOR 1 DAY FOR FEVER BLISTER. 30 tablet 0   estradiol (VIVELLE-DOT) 0.05 MG/24HR patch Place 1 patch (0.05 mg total) onto the skin 2 (two) times a week. 8 patch 2   HYDROcodone-acetaminophen (NORCO) 5-325 MG tablet 1-2 tabs po q6 hours prn pain (Patient not taking: Reported on 03/22/2021) 20 tablet 0   medroxyPROGESTERone (PROVERA) 5 MG tablet Take 1 tab orally day 1-15 each month. 45 tablet 0   No current facility-administered medications for this visit.    Family History  Problem Relation Age of Onset   Coronary artery disease Father    Diabetes Father    Hyperlipidemia Father    Colon polyps Father  Diverticulosis Father    Heart attack Father    Coronary artery disease Paternal Grandmother    Coronary artery disease Maternal Grandmother    Colon cancer Maternal Grandfather    Alcohol abuse Maternal Grandfather    Lung cancer Paternal Grandfather    Hypertension Sister     Review of Systems  All other systems reviewed and are negative.  Exam:   BP 122/73 (BP Location: Right Arm, Patient Position: Sitting, Cuff Size: Large)   Pulse 69   Ht 5\' 4"  (1.626 m)   Wt 152 lb 3.2 oz (69 kg)   BMI 26.13 kg/m   Height: 5\' 4"   (162.6 cm)  General appearance: alert, cooperative and appears stated age Head: Normocephalic, without obvious abnormality, atraumatic Neck: no adenopathy, supple, symmetrical, trachea midline and thyroid normal to inspection and palpation Lungs: clear to auscultation bilaterally Breasts: normal appearance, no masses or tenderness Heart: regular rate and rhythm Abdomen: soft, non-tender; bowel sounds normal; no masses,  no organomegaly Extremities: extremities normal, atraumatic, no cyanosis or edema Skin: Skin color, texture, turgor normal. No rashes or lesions Lymph nodes: Cervical, supraclavicular, and axillary nodes normal. No abnormal inguinal nodes palpated Neurologic: Grossly normal   Pelvic: External genitalia:  no lesions              Urethra:  normal appearing urethra with no masses, tenderness or lesions              Bartholins and Skenes: normal                 Vagina: normal appearing vagina with normal color and no discharge, no lesions              Cervix: no lesions              Pap taken: Yes.   Bimanual Exam:  Uterus:  normal size, contour, position, consistency, mobility, non-tender              Adnexa: normal adnexa and no mass, fullness, tenderness               Rectovaginal: Confirms               Anus:  normal sphincter tone, no lesions  Chaperone, Octaviano Batty, CMA, was present for exam.  Assessment/Plan: 1. Well woman exam with routine gynecological exam - pap smear and HR HPV obtained today - MMG is due.  Pt is aware. - Colonoscopy due next year - plan lab work next year - BMD will be done closer to age 47  2. Hormone replacement therapy (HRT) - RF for estradiol patch and provera for 3 months given.  Pt knows she needs MMG updated  3. History of HPV infection - Cytology - PAP( [facility])  4  HSV - no RF for valtrex needed.

## 2021-03-24 LAB — CYTOLOGY - PAP
Comment: NEGATIVE
Diagnosis: NEGATIVE
High risk HPV: NEGATIVE

## 2021-04-07 ENCOUNTER — Other Ambulatory Visit: Payer: Self-pay | Admitting: Obstetrics & Gynecology

## 2021-04-12 NOTE — Telephone Encounter (Signed)
LMOVM for pt to call office regarding refill request 

## 2021-06-08 DIAGNOSIS — Z713 Dietary counseling and surveillance: Secondary | ICD-10-CM | POA: Diagnosis not present

## 2021-06-08 DIAGNOSIS — M0580 Other rheumatoid arthritis with rheumatoid factor of unspecified site: Secondary | ICD-10-CM | POA: Diagnosis not present

## 2021-06-08 DIAGNOSIS — K582 Mixed irritable bowel syndrome: Secondary | ICD-10-CM | POA: Diagnosis not present

## 2021-06-08 DIAGNOSIS — Z6831 Body mass index (BMI) 31.0-31.9, adult: Secondary | ICD-10-CM | POA: Diagnosis not present

## 2021-07-02 ENCOUNTER — Other Ambulatory Visit: Payer: Self-pay | Admitting: Obstetrics & Gynecology

## 2021-07-07 NOTE — Telephone Encounter (Signed)
LMOVM for pt to call regarding refill request 

## 2021-07-08 ENCOUNTER — Other Ambulatory Visit (HOSPITAL_BASED_OUTPATIENT_CLINIC_OR_DEPARTMENT_OTHER): Payer: Self-pay | Admitting: Obstetrics & Gynecology

## 2021-07-08 ENCOUNTER — Encounter (HOSPITAL_BASED_OUTPATIENT_CLINIC_OR_DEPARTMENT_OTHER): Payer: Self-pay | Admitting: Obstetrics & Gynecology

## 2021-07-08 DIAGNOSIS — Z1231 Encounter for screening mammogram for malignant neoplasm of breast: Secondary | ICD-10-CM

## 2021-07-08 NOTE — Telephone Encounter (Signed)
Refill declined.  Mychart message sent to pt that she needs to update her mammogram.  Was last done in 2020.  Number given to her to call for scheduling.

## 2021-07-12 ENCOUNTER — Ambulatory Visit (HOSPITAL_BASED_OUTPATIENT_CLINIC_OR_DEPARTMENT_OTHER): Admission: RE | Admit: 2021-07-12 | Payer: BC Managed Care – PPO | Source: Ambulatory Visit | Admitting: Radiology

## 2021-07-13 ENCOUNTER — Other Ambulatory Visit: Payer: Self-pay

## 2021-07-13 ENCOUNTER — Ambulatory Visit (HOSPITAL_BASED_OUTPATIENT_CLINIC_OR_DEPARTMENT_OTHER)
Admission: RE | Admit: 2021-07-13 | Discharge: 2021-07-13 | Disposition: A | Discharge status: Home / Self Care | Payer: BC Managed Care – PPO | Source: Ambulatory Visit | Attending: Obstetrics & Gynecology | Admitting: Obstetrics & Gynecology

## 2021-07-13 ENCOUNTER — Encounter (HOSPITAL_BASED_OUTPATIENT_CLINIC_OR_DEPARTMENT_OTHER): Payer: Self-pay | Admitting: Radiology

## 2021-07-13 DIAGNOSIS — K580 Irritable bowel syndrome with diarrhea: Secondary | ICD-10-CM | POA: Diagnosis not present

## 2021-07-13 DIAGNOSIS — M0580 Other rheumatoid arthritis with rheumatoid factor of unspecified site: Secondary | ICD-10-CM | POA: Diagnosis not present

## 2021-07-13 DIAGNOSIS — Z1231 Encounter for screening mammogram for malignant neoplasm of breast: Secondary | ICD-10-CM | POA: Diagnosis not present

## 2021-07-13 DIAGNOSIS — Z713 Dietary counseling and surveillance: Secondary | ICD-10-CM | POA: Diagnosis not present

## 2021-07-13 DIAGNOSIS — Z6831 Body mass index (BMI) 31.0-31.9, adult: Secondary | ICD-10-CM | POA: Diagnosis not present

## 2021-07-15 ENCOUNTER — Other Ambulatory Visit (HOSPITAL_BASED_OUTPATIENT_CLINIC_OR_DEPARTMENT_OTHER): Payer: Self-pay | Admitting: Obstetrics & Gynecology

## 2021-07-15 DIAGNOSIS — Z79890 Hormone replacement therapy (postmenopausal): Secondary | ICD-10-CM

## 2021-07-15 MED ORDER — ESTRADIOL 0.05 MG/24HR TD PTTW: 1.0000 | MEDICATED_PATCH | TRANSDERMAL | 3 refills | Status: DC

## 2021-07-15 MED ORDER — MEDROXYPROGESTERONE ACETATE 5 MG PO TABS: ORAL_TABLET | ORAL | 3 refills | Status: DC

## 2021-09-07 DIAGNOSIS — Z713 Dietary counseling and surveillance: Secondary | ICD-10-CM | POA: Diagnosis not present

## 2021-09-07 DIAGNOSIS — K582 Mixed irritable bowel syndrome: Secondary | ICD-10-CM | POA: Diagnosis not present

## 2021-09-07 DIAGNOSIS — Z6830 Body mass index (BMI) 30.0-30.9, adult: Secondary | ICD-10-CM | POA: Diagnosis not present

## 2021-09-08 DIAGNOSIS — D225 Melanocytic nevi of trunk: Secondary | ICD-10-CM | POA: Diagnosis not present

## 2021-09-08 DIAGNOSIS — D2261 Melanocytic nevi of right upper limb, including shoulder: Secondary | ICD-10-CM | POA: Diagnosis not present

## 2021-09-08 DIAGNOSIS — L821 Other seborrheic keratosis: Secondary | ICD-10-CM | POA: Diagnosis not present

## 2021-09-08 DIAGNOSIS — Z85828 Personal history of other malignant neoplasm of skin: Secondary | ICD-10-CM | POA: Diagnosis not present

## 2021-10-29 ENCOUNTER — Ambulatory Visit
Admission: EM | Admit: 2021-10-29 | Discharge: 2021-10-29 | Disposition: A | Discharge status: Home / Self Care | Payer: BC Managed Care – PPO | Attending: Physician Assistant | Admitting: Physician Assistant

## 2021-10-29 DIAGNOSIS — J0120 Acute ethmoidal sinusitis, unspecified: Secondary | ICD-10-CM | POA: Diagnosis not present

## 2021-10-29 MED ORDER — BENZONATATE 100 MG PO CAPS: 100.0000 mg | ORAL_CAPSULE | Freq: Four times a day (QID) | ORAL | 0 refills | Status: DC | PRN

## 2021-10-29 MED ORDER — AMOXICILLIN 500 MG PO CAPS: 500.0000 mg | ORAL_CAPSULE | Freq: Three times a day (TID) | ORAL | 0 refills | Status: DC

## 2021-10-29 NOTE — Discharge Instructions (Addendum)
Return if any problems.

## 2021-10-29 NOTE — ED Triage Notes (Signed)
Pt presents with sore throat and non productive cough X 20 days with no relief with OTC medication.

## 2021-11-03 NOTE — ED Provider Notes (Signed)
[facility]    CSN: [id number] Arrival date & time: 10/29/21  0830      History   Chief Complaint Chief Complaint  Patient presents with   URI    HPI Meghan Frank is a 56 y.o. female.   The history is provided by the patient. No language interpreter was used.  URI Presenting symptoms: congestion and cough   Cough:    Cough characteristics:  Productive   Sputum characteristics:  Nondescript   Severity:  Moderate   Duration:  20 days   Timing:  Constant   Progression:  Worsening Severity:  Moderate Onset quality:  Gradual Duration:  20 days Timing:  Constant Progression:  Worsening Chronicity:  New Relieved by:  Nothing Worsened by:  Nothing Risk factors: no recent illness    Past Medical History:  Diagnosis Date   Abnormal Pap smear of cervix about 2005   colpo biopsy was benign   Anemia    Complication of anesthesia    "doesn't wake up well"   Elevated bilirubin    declines evaluation.   Gallstone    Internal/external hemorrhoids with bleeding 01/30/2012   PONV (postoperative nausea and vomiting)    Recurrent herpes labialis    Recurrent nephrolithiasis     Patient Active Problem List   Diagnosis Date Noted   Closed fracture of right distal radius 06/19/2020   Hormone replacement therapy (HRT) 03/29/2020   Menopausal symptoms 03/29/2020   Healthcare maintenance 01/30/2019   Hypertrophy of tonsils alone 01/30/2019   Staphylococcus aureus infection 03/14/2014   Nausea alone 12/27/2013   Hx of urinary stone 12/27/2013   Internal/external hemorrhoids with bleeding 01/30/2012   Neuroma of hand 12/01/2011   Sleep disorder 08/25/2011   Fatigue 08/25/2011   Moodiness 08/25/2011   UNSPEC LOCAL INFECTION SKIN&SUBCUTANEOUS TISSUE 04/27/2010   NEPHROLITHIASIS, HX OF 12/24/2008   HERPES SIMPLEX INFECTION, RECURRENT 01/29/2007   HEMATURIA 01/29/2007    Past Surgical History:  Procedure Laterality Date   CHOLECYSTECTOMY  2008    COLONOSCOPY  01/30/2012   Procedure: COLONOSCOPY;  Surgeon: Gatha Mayer, MD;  Location: [facility];  Service: Endoscopy;  Laterality: N/A;   CYSTOSCOPY/RETROGRADE/URETEROSCOPY/STONE EXTRACTION WITH BASKET  04/29/2011   Procedure: CYSTOSCOPY/RETROGRADE/URETEROSCOPY/STONE EXTRACTION WITH BASKET;  Surgeon: Ailene Rud, MD;  Location: [facility];  Service: Urology;  Laterality: Bilateral;  bilateral retrogrades, bilateral ureteroscopy, left stone basketry   HEMORRHOID SURGERY  2007   LASER ABLATION  2010   uterine ablation   LITHOTRIPSY  2008   [place]   MASS EXCISION  11/25/2011   right hand-neuroma removed   OPEN REDUCTION INTERNAL FIXATION (ORIF) DISTAL RADIAL FRACTURE Right 06/23/2020   Procedure: OPEN REDUCTION INTERNAL FIXATION (ORIF) DISTAL RADIAL FRACTURE; BRACHIORADIALIS RELEASE;  Surgeon: Leanora Cover, MD;  Location: [place];  Service: Orthopedics;  Laterality: Right;  regional block (SCB)   TUBAL LIGATION  2000    OB History     Gravida  2   Para  2   Term  2   Preterm  0   AB  0   Living  2      SAB  0   IAB  0   Ectopic  0   Multiple  0   Live Births  2            Home Medications    Prior to Admission medications   Medication Sig Start Date End Date Taking? Authorizing Provider  amoxicillin (AMOXIL)  500 MG capsule Take 1 capsule (500 mg total) by mouth 3 (three) times daily. 10/29/21  Yes Caryl Ada K, PA-C  benzonatate (TESSALON PERLES) 100 MG capsule Take 1 capsule (100 mg total) by mouth every 6 (six) hours as needed for cough. 10/29/21 10/29/22 Yes Fransico Meadow, PA-C  estradiol (VIVELLE-DOT) 0.05 MG/24HR patch Place 1 patch (0.05 mg total) onto the skin 2 (two) times a week. 07/15/21   [name], MD  HYDROcodone-acetaminophen [facility]) 5-325 MG tablet 1-2 tabs po q6 hours prn pain Patient not taking: Reported on 03/22/2021 06/23/20   Leanora Cover, MD  medroxyPROGESTERone (PROVERA) 5 MG tablet Take 1 tab  orally day 1-15 each month. 07/15/21   [name], MD  Multiple Vitamin (MULTIVITAMIN) tablet Take 1 tablet by mouth daily.    [provider]  Phenylephrine-DM-GG-APAP ([place] SINUS-MAX) 5-10-200-325 MG CAPS Take by mouth.    [provider]  valACYclovir (VALTREX) 500 MG tablet Take 1 tablet (500 mg total) by mouth daily. 03/22/21   [name], MD    Family History Family History  Problem Relation Age of Onset   Coronary artery disease Father    Diabetes Father    Hyperlipidemia Father    Colon polyps Father    Diverticulosis Father    Heart attack Father    Coronary artery disease Paternal Grandmother    Coronary artery disease Maternal Grandmother    Colon cancer Maternal Grandfather    Alcohol abuse Maternal Grandfather    Lung cancer Paternal Grandfather    Hypertension Sister     Social History Social History   Tobacco Use   Smoking status: Former    Packs/day: 0.50    Years: 0.00    Pack years: 0.00    Types: Cigarettes    Quit date: 04/26/2011    Years since quitting: 10.5   Smokeless tobacco: Never  Vaping Use   Vaping Use: Never used  Substance Use Topics   Alcohol use: Yes    Alcohol/week: 0.0 - 1.0 standard drinks   Drug use: No     Allergies   Bee venom and Sulfonamide derivatives   Review of Systems Review of Systems  HENT:  Positive for congestion.   Respiratory:  Positive for cough.   All other systems reviewed and are negative.   Physical Exam Triage Vital Signs ED Triage Vitals  Enc Vitals Group     BP 10/29/21 0934 (!) 163/94     Pulse Rate 10/29/21 0934 78     Resp 10/29/21 0934 17     Temp 10/29/21 0934 98.5 F (36.9 C)     Temp Source 10/29/21 0934 Oral     SpO2 10/29/21 0934 98 %     Weight --      Height --      Head Circumference --      Peak Flow --      Pain Score 10/29/21 0933 5     Pain Loc --      Pain Edu? --      Excl. in [place]? --    No data found.  Updated Vital Signs BP (!) 163/94  (BP Location: Left Arm)   Pulse 78   Temp 98.5 F (36.9 C) (Oral)   Resp 17   SpO2 98%   Visual Acuity Right Eye Distance:   Left Eye Distance:   Bilateral Distance:    Right Eye Near:   Left Eye Near:    Bilateral  Near:     Physical Exam Vitals reviewed.  Constitutional:      Appearance: Normal appearance.  HENT:     Right Ear: Tympanic membrane normal.     Nose: Nose normal.     Mouth/Throat:     Mouth: Mucous membranes are moist.  Cardiovascular:     Rate and Rhythm: Normal rate.  Pulmonary:     Effort: Pulmonary effort is normal.  Abdominal:     General: Abdomen is flat.  Skin:    General: Skin is warm.  Neurological:     General: No focal deficit present.     Mental Status: She is alert.  Psychiatric:        Mood and Affect: Mood normal.     UC Treatments / Results  Labs (all labs ordered are listed, but only abnormal results are displayed) Labs Reviewed - No data to display  EKG   Radiology No results found.  Procedures Procedures (including critical care time)  Medications Ordered in UC Medications - No data to display  Initial Impression / Assessment and Plan / UC Course  I have reviewed the triage vital signs and the nursing notes.  Pertinent labs & imaging results that were available during my care of the patient were reviewed by me and considered in my medical decision making (see chart for details).     MDM:  I suspect pt has a sinus infection  I will treat with antibiotics as pt has had symptoms for 3 weeks  Final Clinical Impressions(s) / UC Diagnoses   Final diagnoses:  Acute ethmoidal sinusitis, recurrence not specified     Discharge Instructions      Return if any problems.     ED Prescriptions     Medication Sig Dispense Auth. Provider   amoxicillin (AMOXIL) 500 MG capsule Take 1 capsule (500 mg total) by mouth 3 (three) times daily. 30 capsule Carylon Tamburro K, [place]   benzonatate (TESSALON PERLES) 100 MG capsule Take  1 capsule (100 mg total) by mouth every 6 (six) hours as needed for cough. 30 capsule Fransico Meadow, [place]      PDMP not reviewed this encounter. An After Visit Summary was printed and given to the patient.    Fransico Meadow, [place] 11/03/21 1052

## 2021-11-16 DIAGNOSIS — R0609 Other forms of dyspnea: Secondary | ICD-10-CM | POA: Diagnosis not present

## 2021-11-16 DIAGNOSIS — Z025 Encounter for examination for participation in sport: Secondary | ICD-10-CM | POA: Diagnosis not present

## 2021-11-16 DIAGNOSIS — Z6829 Body mass index (BMI) 29.0-29.9, adult: Secondary | ICD-10-CM | POA: Diagnosis not present

## 2021-11-16 DIAGNOSIS — R635 Abnormal weight gain: Secondary | ICD-10-CM | POA: Diagnosis not present

## 2021-11-16 DIAGNOSIS — Z7182 Exercise counseling: Secondary | ICD-10-CM | POA: Diagnosis not present

## 2021-11-16 DIAGNOSIS — M0580 Other rheumatoid arthritis with rheumatoid factor of unspecified site: Secondary | ICD-10-CM | POA: Diagnosis not present

## 2022-03-25 ENCOUNTER — Ambulatory Visit (INDEPENDENT_AMBULATORY_CARE_PROVIDER_SITE_OTHER): Payer: BC Managed Care – PPO | Admitting: Obstetrics & Gynecology

## 2022-03-25 ENCOUNTER — Encounter: Payer: Self-pay | Admitting: Internal Medicine

## 2022-03-25 ENCOUNTER — Encounter (HOSPITAL_BASED_OUTPATIENT_CLINIC_OR_DEPARTMENT_OTHER): Payer: Self-pay | Admitting: Obstetrics & Gynecology

## 2022-03-25 VITALS — BP 138/74 | HR 59 | Ht 64.0 in | Wt 151.2 lb

## 2022-03-25 DIAGNOSIS — B009 Herpesviral infection, unspecified: Secondary | ICD-10-CM

## 2022-03-25 DIAGNOSIS — Z0000 Encounter for general adult medical examination without abnormal findings: Secondary | ICD-10-CM | POA: Diagnosis not present

## 2022-03-25 DIAGNOSIS — Z01419 Encounter for gynecological examination (general) (routine) without abnormal findings: Secondary | ICD-10-CM

## 2022-03-25 DIAGNOSIS — Z79890 Hormone replacement therapy (postmenopausal): Secondary | ICD-10-CM

## 2022-03-25 MED ORDER — VALACYCLOVIR HCL 500 MG PO TABS: 500.0000 mg | ORAL_TABLET | Freq: Every day | ORAL | 3 refills | Status: DC

## 2022-03-25 MED ORDER — MEDROXYPROGESTERONE ACETATE 5 MG PO TABS: ORAL_TABLET | ORAL | 3 refills | Status: DC

## 2022-03-25 MED ORDER — ESTRADIOL 0.05 MG/24HR TD PTTW: 1.0000 | MEDICATED_PATCH | TRANSDERMAL | 3 refills | Status: DC

## 2022-03-25 NOTE — Progress Notes (Signed)
55 y.o. G1P2002 Married White or Caucasian female here for annual exam.  Doing well.  Denies vaginal bleeding.  On HRT.  Doing well with this dosage.  Needs RF.  Takes daily valtrex.    No LMP recorded. Patient has had an ablation.          Sexually active: Yes.    The current method of family planning is BTL.    Exercising: yes Smoker:  no  Health Maintenance: Pap:  03/22/2021 Normal History of abnormal Pap:  h/o HPV MMG:  07/13/2021 Negative Colonoscopy:  01/30/2012.  Pt is aware this is due.   Screening Labs: lab work planned   reports that she quit smoking about 10 years ago. Her smoking use included cigarettes. She smoked an average of 0.50 packs per day. She has never used smokeless tobacco. She reports current alcohol use. She reports that she does not use drugs.  Past Medical History:  Diagnosis Date   Abnormal Pap smear of cervix about 2005   colpo biopsy was benign   Anemia    Complication of anesthesia    "doesn't wake up well"   Elevated bilirubin    declines evaluation.   Gallstone    Internal/external hemorrhoids with bleeding 01/30/2012   PONV (postoperative nausea and vomiting)    Recurrent herpes labialis    Recurrent nephrolithiasis     Past Surgical History:  Procedure Laterality Date   CHOLECYSTECTOMY  2008   COLONOSCOPY  01/30/2012   Procedure: COLONOSCOPY;  Surgeon: Gatha Mayer, MD;  [facility]: [facility];  Service: Endoscopy;  Laterality: N/A;   CYSTOSCOPY/RETROGRADE/URETEROSCOPY/STONE EXTRACTION WITH BASKET  04/29/2011   Procedure: CYSTOSCOPY/RETROGRADE/URETEROSCOPY/STONE EXTRACTION WITH BASKET;  Surgeon: Ailene Rud, MD;  Location: [facility];  Service: Urology;  Laterality: Bilateral;  bilateral retrogrades, bilateral ureteroscopy, left stone basketry   HEMORRHOID SURGERY  2007   LASER ABLATION  2010   uterine ablation   LITHOTRIPSY  2008   [place]   MASS EXCISION  11/25/2011   right hand-neuroma removed   OPEN REDUCTION  INTERNAL FIXATION (ORIF) DISTAL RADIAL FRACTURE Right 06/23/2020   Procedure: OPEN REDUCTION INTERNAL FIXATION (ORIF) DISTAL RADIAL FRACTURE; BRACHIORADIALIS RELEASE;  Surgeon: Leanora Cover, MD;  Location: [place];  Service: Orthopedics;  Laterality: Right;  regional block (SCB)   TUBAL LIGATION  2000    Current Outpatient Medications  Medication Sig Dispense Refill   estradiol (VIVELLE-DOT) 0.05 MG/24HR patch Place 1 patch (0.05 mg total) onto the skin 2 (two) times a week. 24 patch 3   medroxyPROGESTERone (PROVERA) 5 MG tablet Take 1 tab orally day 1-15 each month. 45 tablet 3   Multiple Vitamin (MULTIVITAMIN) tablet Take 1 tablet by mouth daily.     valACYclovir (VALTREX) 500 MG tablet Take 1 tablet (500 mg total) by mouth daily. 90 tablet 3   No current facility-administered medications for this visit.    Family History  Problem Relation Age of Onset   Coronary artery disease Father    Diabetes Father    Hyperlipidemia Father    Colon polyps Father    Diverticulosis Father    Heart attack Father    Coronary artery disease Paternal Grandmother    Coronary artery disease Maternal Grandmother    Colon cancer Maternal Grandfather    Alcohol abuse Maternal Grandfather    Lung cancer Paternal Grandfather    Hypertension Sister     ROS: Constitutional: negative Genitourinary:negative  Exam:   BP 138/74 (BP Location:  Right Arm, Patient Position: Sitting, Cuff Size: Large)   Pulse (!) 59   Ht '5\' 4"'$  (1.626 m) Comment: reported  Wt 151 lb 3.2 oz (68.6 kg)   BMI 25.95 kg/m   Height: '5\' 4"'$  (162.6 cm) (reported)  General appearance: alert, cooperative and appears stated age Head: Normocephalic, without obvious abnormality, atraumatic Neck: no adenopathy, supple, symmetrical, trachea midline and thyroid normal to inspection and palpation Lungs: clear to auscultation bilaterally Breasts: normal appearance, no masses or tenderness Heart: regular rate and  rhythm Abdomen: soft, non-tender; bowel sounds normal; no masses,  no organomegaly Extremities: extremities normal, atraumatic, no cyanosis or edema Skin: Skin color, texture, turgor normal. No rashes or lesions Lymph nodes: Cervical, supraclavicular, and axillary nodes normal. No abnormal inguinal nodes palpated Neurologic: Grossly normal   Pelvic: External genitalia:  no lesions              Urethra:  normal appearing urethra with no masses, tenderness or lesions              Bartholins and Skenes: normal                 Vagina: normal appearing vagina with normal color and no discharge, no lesions              Cervix: no lesions              Pap taken: No. Bimanual Exam:  Uterus:  normal size, contour, position, consistency, mobility, non-tender              Adnexa: normal adnexa and no mass, fullness, tenderness               Rectovaginal: Confirms               Anus:  normal sphincter tone, no lesions  Chaperone, Octaviano Batty, CMA, was present for exam.  Assessment/Plan: 1. Well woman exam with routine gynecological exam - Pap smear neg with neg HR HPV 2022 - Mammogram 08/2021 - Colonoscopy due.  Pt aware.  Has been contacted about thsi - Bone mineral density not indicated - lab work ordered today - vaccines reviewed/updated  2. Hormone replacement therapy (HRT) - medroxyPROGESTERone (PROVERA) 5 MG tablet; Take 1 tab orally day 1-15 each month.  Dispense: 45 tablet; Refill: 3 - estradiol (VIVELLE-DOT) 0.05 MG/24HR patch; Place 1 patch (0.05 mg total) onto the skin 2 (two) times a week.  Dispense: 24 patch; Refill: 3  3. HSV (herpes simplex virus) infection - valACYclovir (VALTREX) 500 MG tablet; Take 1 tablet (500 mg total) by mouth daily.  Dispense: 90 tablet; Refill: 3  4. Blood tests for routine general physical examination - CBC - Comprehensive metabolic panel - Hemoglobin A1c - TSH - Lipid panel

## 2022-03-26 LAB — CBC
Hematocrit: 38.3 % (ref 34.0–46.6)
Hemoglobin: 12.8 g/dL (ref 11.1–15.9)
MCH: 31.2 pg (ref 26.6–33.0)
MCHC: 33.4 g/dL (ref 31.5–35.7)
MCV: 93 fL (ref 79–97)
Platelets: 279 10*3/uL (ref 150–450)
RBC: 4.1 x10E6/uL (ref 3.77–5.28)
RDW: 11.9 % (ref 11.7–15.4)
WBC: 8 10*3/uL (ref 3.4–10.8)

## 2022-03-26 LAB — COMPREHENSIVE METABOLIC PANEL
ALT: 14 IU/L (ref 0–32)
AST: 20 IU/L (ref 0–40)
Albumin/Globulin Ratio: 1.9 (ref 1.2–2.2)
Albumin: 4.6 g/dL (ref 3.8–4.9)
Alkaline Phosphatase: 51 IU/L (ref 44–121)
BUN/Creatinine Ratio: 20 (ref 9–23)
BUN: 17 mg/dL (ref 6–24)
Bilirubin Total: 1.2 mg/dL (ref 0.0–1.2)
CO2: 25 mmol/L (ref 20–29)
Calcium: 9.4 mg/dL (ref 8.7–10.2)
Chloride: 100 mmol/L (ref 96–106)
Creatinine, Ser: 0.85 mg/dL (ref 0.57–1.00)
Globulin, Total: 2.4 g/dL (ref 1.5–4.5)
Glucose: 89 mg/dL (ref 70–99)
Potassium: 3.9 mmol/L (ref 3.5–5.2)
Sodium: 138 mmol/L (ref 134–144)
Total Protein: 7 g/dL (ref 6.0–8.5)
eGFR: 81 mL/min/{1.73_m2} (ref >59)

## 2022-03-26 LAB — LIPID PANEL
Chol/HDL Ratio: 3 ratio (ref 0.0–4.4)
Cholesterol, Total: 164 mg/dL (ref 100–199)
HDL: 55 mg/dL (ref >39)
LDL Chol Calc (NIH): 99 mg/dL (ref 0–99)
Triglycerides: 46 mg/dL (ref 0–149)
VLDL Cholesterol Cal: 10 mg/dL (ref 5–40)

## 2022-03-26 LAB — HEMOGLOBIN A1C
Est. average glucose Bld gHb Est-mCnc: 111 mg/dL
Hgb A1c MFr Bld: 5.5 % (ref 4.8–5.6)

## 2022-03-26 LAB — TSH: TSH: 1.75 u[IU]/mL (ref 0.450–4.500)

## 2022-04-12 ENCOUNTER — Ambulatory Visit (AMBULATORY_SURGERY_CENTER): Payer: Self-pay | Admitting: *Deleted

## 2022-04-12 VITALS — Ht 64.0 in | Wt 150.8 lb

## 2022-04-12 DIAGNOSIS — Z1211 Encounter for screening for malignant neoplasm of colon: Secondary | ICD-10-CM

## 2022-04-12 NOTE — Progress Notes (Signed)

## 2022-04-19 ENCOUNTER — Encounter: Payer: Self-pay | Admitting: Internal Medicine

## 2022-04-25 NOTE — Progress Notes (Signed)
[facility] History and Physical   Primary Care Physician:  Lorrene Reid, PA-C   Reason for Procedure:  Colon cancer screening  Plan:    Colonoscopy     HPI: Meghan Frank is a 56 y.o. female status post negative screening colonoscopy 2012.  She did have external and internal hemorrhoids.   Past Medical History:  Diagnosis Date   Abnormal Pap smear of cervix about 2005   colpo biopsy was benign   Allergy    seasonal   Anemia    Cancer ([place])    skin,face   Complication of anesthesia    "doesn't wake up well"   Elevated bilirubin    declines evaluation.   Gallstone    Internal/external hemorrhoids with bleeding 01/30/2012   PONV (postoperative nausea and vomiting)    Recurrent herpes labialis     Past Surgical History:  Procedure Laterality Date   CHOLECYSTECTOMY  06/06/2006   COLONOSCOPY  01/30/2012   Procedure: COLONOSCOPY;  Surgeon: Gatha Mayer, MD;  [facility];  Service: Endoscopy;  Laterality: N/A;   CYSTOSCOPY/RETROGRADE/URETEROSCOPY/STONE EXTRACTION WITH BASKET  04/29/2011   Procedure: CYSTOSCOPY/RETROGRADE/URETEROSCOPY/STONE EXTRACTION WITH BASKET;  Surgeon: Ailene Rud, MD;  Location: [facility];  Service: Urology;  Laterality: Bilateral;  bilateral retrogrades, bilateral ureteroscopy, left stone basketry   HEMORRHOID SURGERY  06/06/2005   LASER ABLATION  06/06/2008   uterine ablation   LITHOTRIPSY  06/06/2006   MANDIBLE SURGERY  06/07/1983   MASS EXCISION  11/25/2011   right hand-neuroma removed   OPEN REDUCTION INTERNAL FIXATION (ORIF) DISTAL RADIAL FRACTURE Right 06/23/2020   Procedure: OPEN REDUCTION INTERNAL FIXATION (ORIF) DISTAL RADIAL FRACTURE; BRACHIORADIALIS RELEASE;  Surgeon: Leanora Cover, MD;  Location: [place];  Service: Orthopedics;  Laterality: Right;  regional block (SCB)   TUBAL LIGATION  06/06/1998   uretheral strecth     1973    Prior to Admission medications   Medication Sig  Start Date End Date Taking? Authorizing Provider  estradiol (VIVELLE-DOT) 0.05 MG/24HR patch Place 1 patch (0.05 mg total) onto the skin 2 (two) times a week. 03/28/22   [name], MD  medroxyPROGESTERone (PROVERA) 5 MG tablet Take 1 tab orally day 1-15 each month. 03/25/22   [name], MD  Multiple Vitamin (MULTIVITAMIN) tablet Take 1 tablet by mouth daily.    [provider]  Probiotic Product (PROBIOTIC DAILY PO) Take by mouth daily.    [provider]  valACYclovir (VALTREX) 500 MG tablet Take 1 tablet (500 mg total) by mouth daily. 03/25/22   [name], MD    Current Outpatient Medications  Medication Sig Dispense Refill   estradiol (VIVELLE-DOT) 0.05 MG/24HR patch Place 1 patch (0.05 mg total) onto the skin 2 (two) times a week. 24 patch 3   medroxyPROGESTERone (PROVERA) 5 MG tablet Take 1 tab orally day 1-15 each month. 45 tablet 3   Multiple Vitamin (MULTIVITAMIN) tablet Take 1 tablet by mouth daily.     Probiotic Product (PROBIOTIC DAILY PO) Take by mouth daily.     valACYclovir (VALTREX) 500 MG tablet Take 1 tablet (500 mg total) by mouth daily. 90 tablet 3   No current facility-administered medications for this visit.    Allergies as of 04/26/2022 - Review Complete 04/12/2022  Allergen Reaction Noted   Bee venom Other (See Comments) 04/29/2011   Sulfonamide derivatives Rash 01/29/2007    Family History  Problem Relation Age of Onset   Coronary artery disease Father  Diabetes Father    Hyperlipidemia Father    Colon polyps Father    Diverticulosis Father    Heart attack Father    Hypertension Sister    Coronary artery disease Maternal Grandmother    Colon cancer Maternal Grandfather    Alcohol abuse Maternal Grandfather    Coronary artery disease Paternal Grandmother    Lung cancer Paternal Grandfather    Crohn's disease Neg Hx    Esophageal cancer Neg Hx    Rectal cancer Neg Hx    Stomach cancer Neg Hx    Ulcerative colitis Neg  Hx     Social History   Socioeconomic History   Marital status: Married    Spouse name: Not on file   Number of children: Not on file   Years of education: Not on file   Highest education level: Not on file  Occupational History   Not on file  Tobacco Use   Smoking status: Former    Packs/day: 0.50    Years: 0.00    Total pack years: 0.00    Types: Cigarettes    Quit date: 04/26/2011    Years since quitting: 11.0    Passive exposure: Past (grandparents smoked)   Smokeless tobacco: Never  Vaping Use   Vaping Use: Never used  Substance and Sexual Activity   Alcohol use: Not Currently    Alcohol/week: 0.0 - 1.0 standard drinks of alcohol   Drug use: No   Sexual activity: Yes    Birth control/protection: Surgical  Other Topics Concern   Not on file  Social History Narrative   Former smoker   HH of 2   Pet dog and GP and [employer]   Works [place] lives out town in [place]   Daily caffeine   Social Determinants of Radio broadcast assistant Strain: Not on [employer] Insecurity: Not on file  Transportation Needs: Not on file  Physical Activity: Not on file  Stress: Not on file  Social Connections: Not on file  Intimate Partner Violence: Not on file    Review of Systems: Positive for *** All other review of systems negative except as mentioned in the HPI.  Physical Exam: Vital signs There were no vitals taken for this visit.  General:   Alert,  Well-developed, well-nourished, pleasant and cooperative in NAD Lungs:  Clear throughout to auscultation.   Heart:  Regular rate and rhythm; no murmurs, clicks, rubs,  or gallops. Abdomen:  Soft, nontender and nondistended. Normal bowel sounds.   Neuro/Psych:  Alert and cooperative. Normal mood and affect. A and O x 3   '@Skylin Kennerson'$  Simonne Maffucci, MD, [facility] [phone] (pager) 04/25/2022 5:58 PM@

## 2022-04-26 ENCOUNTER — Encounter: Payer: Self-pay | Admitting: Internal Medicine

## 2022-04-26 ENCOUNTER — Ambulatory Visit (AMBULATORY_SURGERY_CENTER): Payer: BC Managed Care – PPO | Admitting: Internal Medicine

## 2022-04-26 ENCOUNTER — Encounter: Payer: BC Managed Care – PPO | Admitting: Internal Medicine

## 2022-04-26 VITALS — BP 116/68 | HR 71 | Temp 97.5°F | Resp 14 | Ht 64.0 in | Wt 150.0 lb

## 2022-04-26 DIAGNOSIS — K621 Rectal polyp: Secondary | ICD-10-CM

## 2022-04-26 DIAGNOSIS — K635 Polyp of colon: Secondary | ICD-10-CM

## 2022-04-26 DIAGNOSIS — Z1211 Encounter for screening for malignant neoplasm of colon: Secondary | ICD-10-CM

## 2022-04-26 DIAGNOSIS — D123 Benign neoplasm of transverse colon: Secondary | ICD-10-CM

## 2022-04-26 DIAGNOSIS — D128 Benign neoplasm of rectum: Secondary | ICD-10-CM

## 2022-04-26 MED ORDER — SODIUM CHLORIDE 0.9 % IV SOLN: 500.0000 mL | Freq: Once | INTRAVENOUS | Status: DC

## 2022-04-26 NOTE — Op Note (Signed)
[place] Patient Name: Meghan Frank Procedure Date: 04/26/2022 9:26 AM MRN: [id number] Endoscopist: Gatha Mayer , MD, [id number] Age: 56 Referring MD:  Date of Birth: 1965/10/01 Gender: Female Account #: [id number] Procedure:                Colonoscopy Indications:              Screening for colorectal malignant neoplasm, Last                            colonoscopy: 2012 Medicines:                Monitored Anesthesia Care Procedure:                Pre-Anesthesia Assessment:                           - Prior to the procedure, a History and Physical                            was performed, and patient medications and                            allergies were reviewed. The patient's tolerance of                            previous anesthesia was also reviewed. The risks                            and benefits of the procedure and the sedation                            options and risks were discussed with the patient.                            All questions were answered, and informed consent                            was obtained. Prior Anticoagulants: The patient has                            taken no anticoagulant or antiplatelet agents. ASA                            Grade Assessment: II - A patient with mild systemic                            disease. After reviewing the risks and benefits,                            the patient was deemed in satisfactory condition to                            undergo the procedure.  After obtaining informed consent, the colonoscope                            was passed under direct vision. Throughout the                            procedure, the patient's blood pressure, pulse, and                            oxygen saturations were monitored continuously. The                            Olympus PCF-H190DL ([id number]) Colonoscope was                            introduced through the anus and  advanced to the the                            cecum, identified by appendiceal orifice and                            ileocecal valve. The colonoscopy was somewhat                            difficult due to significant looping. Successful                            completion of the procedure was aided by applying                            abdominal pressure. The patient tolerated the                            procedure well. The quality of the bowel                            preparation was good. The bowel preparation used                            was Miralax via split dose instruction. Scope In: 9:33:45 AM Scope Out: 9:59:53 AM Scope Withdrawal Time: 0 hours 19 minutes 36 seconds  Total Procedure Duration: 0 hours 26 minutes 8 seconds  Findings:                 Three sessile polyps were found in the rectum and                            transverse colon. The polyps were 3 to 8 mm in                            size. These polyps were removed with a cold snare.                            Resection and  retrieval were complete. Verification                            of patient identification for the specimen was                            done. Estimated blood loss was minimal.                           External and internal hemorrhoids were found during                            perianal exam.                           The exam was otherwise without abnormality on                            direct and retroflexion views. Complications:            No immediate complications. Estimated Blood Loss:     Estimated blood loss was minimal. Impression:               - Three 3 to 8 mm polyps in the rectum and in the                            transverse colon, removed with a cold snare.                            Resected and retrieved.                           - External and internal hemorrhoids.                           - The examination was otherwise normal on direct                             and retroflexion views. Recommendation:           - Patient has a contact number available for                            emergencies. The signs and symptoms of potential                            delayed complications were discussed with the                            patient. Return to normal activities tomorrow.                            Written discharge instructions were provided to the                            patient.                           -  Resume previous diet.                           - Continue present medications.                           - Await pathology results.                           - Repeat colonoscopy is recommended. The                            colonoscopy date will be determined after pathology                            results from today's exam become available for                            review. Gatha Mayer, MD 04/26/2022 10:10:32 AM This report has been signed electronically.

## 2022-04-26 NOTE — Patient Instructions (Addendum)
I found and removed 3 small polyps that look benign but might be pre-cancerous.  Still have hemorrhoids as I am sure you know.  I will let you know pathology results and when to have another routine colonoscopy by mail and/or My Chart.  Please resume normal diet and medications.  I appreciate the opportunity to care for you. Gatha Mayer, MD, [facility]  Handout on polyps and hemorrhoid provided   YOU HAD AN ENDOSCOPIC PROCEDURE TODAY AT [facility]:   Refer to the procedure report that was given to you for any specific questions about what was found during the examination.  If the procedure report does not answer your questions, please call your gastroenterologist to clarify.  If you requested that your care partner not be given the details of your procedure findings, then the procedure report has been included in a sealed envelope for you to review at your convenience later.  YOU SHOULD EXPECT: Some feelings of bloating in the abdomen. Passage of more gas than usual.  Walking can help get rid of the air that was put into your GI tract during the procedure and reduce the bloating. If you had a lower endoscopy (such as a colonoscopy or flexible sigmoidoscopy) you may notice spotting of blood in your stool or on the toilet paper. If you underwent a bowel prep for your procedure, you may not have a normal bowel movement for a few days.  Please Note:  You might notice some irritation and congestion in your nose or some drainage.  This is from the oxygen used during your procedure.  There is no need for concern and it should clear up in a day or so.  SYMPTOMS TO REPORT IMMEDIATELY:  Following lower endoscopy (colonoscopy or flexible sigmoidoscopy):  Excessive amounts of blood in the stool  Significant tenderness or worsening of abdominal pains  Swelling of the abdomen that is new, acute  Fever of 100F or higher   For urgent or emergent issues, a gastroenterologist can be  reached at any hour by calling [phone]. Do not use MyChart messaging for urgent concerns.    DIET:  We do recommend a small meal at first, but then you may proceed to your regular diet.  Drink plenty of fluids but you should avoid alcoholic beverages for 24 hours.  ACTIVITY:  You should plan to take it easy for the rest of today and you should NOT DRIVE or use heavy machinery until tomorrow (because of the sedation medicines used during the test).    FOLLOW UP: Our staff will call the number listed on your records the next business day following your procedure.  We will call around 7:15- 8:00 am to check on you and address any questions or concerns that you may have regarding the information given to you following your procedure. If we do not reach you, we will leave a message.     If any biopsies were taken you will be contacted by phone or by letter within the next 1-3 weeks.  Please call us at [phone] if you have not heard about the biopsies in 3 weeks.    SIGNATURES/CONFIDENTIALITY: You and/or your care partner have signed paperwork which will be entered into your electronic medical record.  These signatures attest to the fact that that the information above on your After Visit Summary has been reviewed and is understood.  Full responsibility of the confidentiality of this discharge information lies with you and/or your care-partner.

## 2022-04-26 NOTE — Progress Notes (Signed)
Pt's states no medical or surgical changes since previsit or office visit. 

## 2022-04-26 NOTE — Progress Notes (Signed)
Report to pacu rn. Vss. Care resumed by rn. 

## 2022-04-26 NOTE — Progress Notes (Signed)
Called to room to assist during endoscopic procedure.  Patient ID and intended procedure confirmed with present staff. Received instructions for my participation in the procedure from the performing physician.  

## 2022-04-27 ENCOUNTER — Telehealth: Payer: Self-pay

## 2022-04-27 NOTE — Telephone Encounter (Signed)
  Follow up Call-     04/26/2022    9:00 AM  Call back number  Post procedure Call Back phone  # [phone]  Permission to leave phone message Yes     Patient questions:  Do you have a fever, pain , or abdominal swelling? No. Pain Score  0 *  Have you tolerated food without any problems? Yes.    Have you been able to return to your normal activities? Yes.    Do you have any questions about your discharge instructions: Diet   No. Medications  No. Follow up visit  No.  Do you have questions or concerns about your Care? No.  Actions: * If pain score is 4 or above: No action needed, pain <4.

## 2022-05-09 ENCOUNTER — Encounter: Payer: Self-pay | Admitting: Internal Medicine

## 2022-05-09 DIAGNOSIS — Z860100 Personal history of colon polyps, unspecified: Secondary | ICD-10-CM

## 2022-05-09 HISTORY — DX: Personal history of colon polyps, unspecified: Z86.0100

## 2022-08-15 ENCOUNTER — Other Ambulatory Visit (HOSPITAL_BASED_OUTPATIENT_CLINIC_OR_DEPARTMENT_OTHER): Payer: Self-pay | Admitting: Obstetrics & Gynecology

## 2022-08-15 DIAGNOSIS — Z1231 Encounter for screening mammogram for malignant neoplasm of breast: Secondary | ICD-10-CM

## 2022-08-16 ENCOUNTER — Ambulatory Visit (HOSPITAL_BASED_OUTPATIENT_CLINIC_OR_DEPARTMENT_OTHER)
Admission: RE | Admit: 2022-08-16 | Discharge: 2022-08-16 | Disposition: A | Discharge status: Home / Self Care | Payer: BC Managed Care – PPO | Source: Ambulatory Visit | Attending: Obstetrics & Gynecology | Admitting: Obstetrics & Gynecology

## 2022-08-16 DIAGNOSIS — Z1231 Encounter for screening mammogram for malignant neoplasm of breast: Secondary | ICD-10-CM | POA: Diagnosis not present

## 2022-09-12 DIAGNOSIS — L821 Other seborrheic keratosis: Secondary | ICD-10-CM | POA: Diagnosis not present

## 2022-09-12 DIAGNOSIS — D225 Melanocytic nevi of trunk: Secondary | ICD-10-CM | POA: Diagnosis not present

## 2022-09-12 DIAGNOSIS — D2261 Melanocytic nevi of right upper limb, including shoulder: Secondary | ICD-10-CM | POA: Diagnosis not present

## 2022-09-12 DIAGNOSIS — Z85828 Personal history of other malignant neoplasm of skin: Secondary | ICD-10-CM | POA: Diagnosis not present

## 2023-04-07 ENCOUNTER — Encounter (HOSPITAL_BASED_OUTPATIENT_CLINIC_OR_DEPARTMENT_OTHER): Payer: Self-pay | Admitting: Obstetrics & Gynecology

## 2023-04-07 ENCOUNTER — Ambulatory Visit (HOSPITAL_BASED_OUTPATIENT_CLINIC_OR_DEPARTMENT_OTHER): Payer: BC Managed Care – PPO | Admitting: Obstetrics & Gynecology

## 2023-04-07 DIAGNOSIS — Z79890 Hormone replacement therapy (postmenopausal): Secondary | ICD-10-CM

## 2023-04-07 DIAGNOSIS — Z01419 Encounter for gynecological examination (general) (routine) without abnormal findings: Secondary | ICD-10-CM | POA: Diagnosis not present

## 2023-04-07 DIAGNOSIS — B009 Herpesviral infection, unspecified: Secondary | ICD-10-CM

## 2023-04-07 MED ORDER — VALACYCLOVIR HCL 500 MG PO TABS: 500.0000 mg | ORAL_TABLET | Freq: Every day | ORAL | 3 refills | Status: DC

## 2023-04-07 MED ORDER — PROGESTERONE 200 MG PO CAPS: ORAL_CAPSULE | ORAL | 3 refills | Status: DC

## 2023-04-07 MED ORDER — ESTRADIOL 0.05 MG/24HR TD PTTW: 1.0000 | MEDICATED_PATCH | TRANSDERMAL | 3 refills | Status: DC

## 2023-04-07 NOTE — Progress Notes (Signed)
57 y.o. G6P2002 Married White or Caucasian female here for annual exam.  Doing well.  Denies vaginal bleeding.  On HRT.  Having some hot flashes when she takes the progesterone days 1-15 each month.  Will try prometrium instead of MPA/    No LMP recorded. Patient has had an ablation.          Sexually active: Yes.    The current method of family planning is post menopausal status.    Exercising: Yes.     Smoker:  no  Health Maintenance: Pap:  03/22/2021 Negative History of abnormal Pap:  h/o HR HPV MMG:  08/16/2022 Negative Colonoscopy:  04/26/2022, follow up 5 years BMD:   guidelines reviewed Screening Labs: done 03/2022   reports that she quit smoking about 11 years ago. Her smoking use included cigarettes. She started smoking about 11 years ago. She has been exposed to tobacco smoke. She has never used smokeless tobacco. She reports that she does not currently use alcohol. She reports that she does not use drugs.  Past Medical History:  Diagnosis Date   Abnormal Pap smear of cervix about 2005   colpo biopsy was benign   Allergy    seasonal   Anemia    Cancer (HCC)    skin,face   Complication of anesthesia    "doesn't wake up well"   Elevated bilirubin    declines evaluation.   Gallstone    Hx of sesssile serrated  colonic polyps 05/09/2022   Internal/external hemorrhoids with bleeding 01/30/2012   PONV (postoperative nausea and vomiting)    Recurrent herpes labialis     Past Surgical History:  Procedure Laterality Date   CHOLECYSTECTOMY  06/06/2006   COLONOSCOPY  01/30/2012   Procedure: COLONOSCOPY;  Surgeon: Iva Boop, MD;  Location: [facility];  Service: Endoscopy;  Laterality: N/A;   CYSTOSCOPY/RETROGRADE/URETEROSCOPY/STONE EXTRACTION WITH BASKET  04/29/2011   Procedure: CYSTOSCOPY/RETROGRADE/URETEROSCOPY/STONE EXTRACTION WITH BASKET;  Surgeon: Kathi Ludwig, MD;  Location: [facility];  Service: Urology;  Laterality: Bilateral;  bilateral retrogrades,  bilateral ureteroscopy, left stone basketry   HEMORRHOID SURGERY  06/06/2005   LASER ABLATION  06/06/2008   uterine ablation   LITHOTRIPSY  06/06/2006   MANDIBLE SURGERY  06/07/1983   MASS EXCISION  11/25/2011   right hand-neuroma removed   OPEN REDUCTION INTERNAL FIXATION (ORIF) DISTAL RADIAL FRACTURE Right 06/23/2020   Procedure: OPEN REDUCTION INTERNAL FIXATION (ORIF) DISTAL RADIAL FRACTURE; BRACHIORADIALIS RELEASE;  Surgeon: Betha Loa, MD;  Location: [facility];  Service: Orthopedics;  Laterality: Right;  regional block (SCB)   TUBAL LIGATION  06/06/1998   uretheral strecth     1973    Current Outpatient Medications  Medication Sig Dispense Refill   estradiol (VIVELLE-DOT) 0.05 MG/24HR patch Place 1 patch (0.05 mg total) onto the skin 2 (two) times a week. 24 patch 3   medroxyPROGESTERone (PROVERA) 5 MG tablet Take 1 tab orally day 1-15 each month. 45 tablet 3   Multiple Vitamin (MULTIVITAMIN) tablet Take 1 tablet by mouth daily.     Probiotic Product (PROBIOTIC DAILY PO) Take by mouth daily.     valACYclovir (VALTREX) 500 MG tablet Take 1 tablet (500 mg total) by mouth daily. 90 tablet 3   No current facility-administered medications for this visit.    Family History  Problem Relation Age of Onset   Coronary artery disease Father    Diabetes Father    Hyperlipidemia Father    Colon polyps Father    Diverticulosis  Father    Heart attack Father    Hypertension Sister    Coronary artery disease Maternal Grandmother    Colon cancer Maternal Grandfather    Alcohol abuse Maternal Grandfather    Coronary artery disease Paternal Grandmother    Lung cancer Paternal Grandfather    Crohn's disease Neg Hx    Esophageal cancer Neg Hx    Rectal cancer Neg Hx    Stomach cancer Neg Hx    Ulcerative colitis Neg Hx     ROS: Constitutional: negative Genitourinary:negative  Exam:   BP 132/73 (BP Location: Right Arm, Patient Position: Sitting, Cuff Size: Large)    Pulse 72   Ht 5\' 4"  (1.626 m)   Wt 150 lb 9.6 oz (68.3 kg)   BMI 25.85 kg/m   Height: 5\' 4"  (162.6 cm)  General appearance: alert, cooperative and appears stated age Head: Normocephalic, without obvious abnormality, atraumatic Neck: no adenopathy, supple, symmetrical, trachea midline and thyroid normal to inspection and palpation Lungs: clear to auscultation bilaterally Breasts: normal appearance, no masses or tenderness Heart: regular rate and rhythm Abdomen: soft, non-tender; bowel sounds normal; no masses,  no organomegaly Extremities: extremities normal, atraumatic, no cyanosis or edema Skin: Skin color, texture, turgor normal. No rashes or lesions Lymph nodes: Cervical, supraclavicular, and axillary nodes normal. No abnormal inguinal nodes palpated Neurologic: Grossly normal   Pelvic: External genitalia:  no lesions              Urethra:  normal appearing urethra with no masses, tenderness or lesions              Bartholins and Skenes: normal                 Vagina: normal appearing vagina with normal color and no discharge, no lesions              Cervix: no lesions              Pap taken: No. Bimanual Exam:  Uterus:  normal size, contour, position, consistency, mobility, non-tender              Adnexa: normal adnexa and no mass, fullness, tenderness               Rectovaginal: Confirms               Anus:  normal sphincter tone, no lesions  Chaperone, Ina Homes, CMA, was present for exam.  Assessment/Plan: 1. Well woman exam with routine gynecological exam - Pap smear neg with neg HR HPV 2022 - Mammogram 08/2022 - Colonoscopy 04/2022 - Bone mineral density guidelines reviewed - lab work done 03/2022 - vaccines reviewed/updated  2. Hormone replacement therapy (HRT) - estradiol (VIVELLE-DOT) 0.05 MG/24HR patch; Place 1 patch (0.05 mg total) onto the skin 2 (two) times a week.  Dispense: 24 patch; Refill: 3 - progesterone (PROMETRIUM) 200 MG capsule; Take days 1-15  each month  Dispense: 45 capsule; Refill: 3.  Will try different progesterone to see if this helps with hot flashes  3. HSV (herpes simplex virus) infection - valACYclovir (VALTREX) 500 MG tablet; Take 1 tablet (500 mg total) by mouth daily.  Dispense: 90 tablet; Refill: 3

## 2023-05-01 ENCOUNTER — Other Ambulatory Visit (HOSPITAL_BASED_OUTPATIENT_CLINIC_OR_DEPARTMENT_OTHER): Payer: Self-pay | Admitting: Obstetrics & Gynecology

## 2023-05-01 DIAGNOSIS — Z79890 Hormone replacement therapy (postmenopausal): Secondary | ICD-10-CM

## 2023-07-02 ENCOUNTER — Telehealth: Payer: BC Managed Care – PPO | Admitting: Family

## 2023-07-02 DIAGNOSIS — J019 Acute sinusitis, unspecified: Secondary | ICD-10-CM

## 2023-07-02 MED ORDER — AMOXICILLIN-POT CLAVULANATE 875-125 MG PO TABS: 1.0000 | ORAL_TABLET | Freq: Two times a day (BID) | ORAL | 0 refills | Status: DC

## 2023-07-02 NOTE — Progress Notes (Signed)
Virtual Visit Consent   Meghan Frank, you are scheduled for a virtual visit with a [facility] provider today. Just as with appointments in the office, your consent must be obtained to participate. Your consent will be active for this visit and any virtual visit you may have with one of our providers in the next 365 days. If you have a MyChart account, a copy of this consent can be sent to you electronically.  As this is a virtual visit, video technology does not allow for your provider to perform a traditional examination. This may limit your provider's ability to fully assess your condition. If your provider identifies any concerns that need to be evaluated in person or the need to arrange testing (such as labs, EKG, etc.), we will make arrangements to do so. Although advances in technology are sophisticated, we cannot ensure that it will always work on either your end or our end. If the connection with a video visit is poor, the visit may have to be switched to a telephone visit. With either a video or telephone visit, we are not always able to ensure that we have a secure connection.  By engaging in this virtual visit, you consent to the provision of healthcare and authorize for your insurance to be billed (if applicable) for the services provided during this visit. Depending on your insurance coverage, you may receive a charge related to this service.  I need to obtain your verbal consent now. Are you willing to proceed with your visit today? Meghan Frank has provided verbal consent on 07/02/2023 for a virtual visit (video or telephone). Jannifer Rodney, FNP  Date: 07/02/2023 2:20 PM  Virtual Visit via Video Note   I, Jannifer Rodney, connected with  Meghan Frank  ([id number], 1965-12-26) on 07/02/23 at  2:15 PM EST by a video-enabled telemedicine application and verified that I am speaking with the correct person using two identifiers.  Location: Patient: Virtual Visit Location Patient:  Home Provider: Virtual Visit Location Provider: Home Office   I discussed the limitations of evaluation and management by telemedicine and the availability of in person appointments. The patient expressed understanding and agreed to proceed.    History of Present Illness: Meghan Frank is a 58 y.o. who identifies as a female who was assigned female at birth, and is being seen today for congestion and sinus pressure. She reports she had these symptoms two weeks ago, but started feeling better for a few days. However, now feels worse.   HPI: Sinusitis This is a new problem. The current episode started 1 to 4 weeks ago. The problem has been gradually worsening since onset. There has been no fever. Her pain is at a severity of 3/10. The pain is moderate. Associated symptoms include chills, congestion, coughing, headaches, a hoarse voice, sinus pressure, sneezing and a sore throat. Past treatments include acetaminophen and oral decongestants. The treatment provided mild relief.    Problems:  Patient Active Problem List   Diagnosis Date Noted   Hx of sesssile serrated  colonic polyps 05/09/2022   Closed fracture of right distal radius 06/19/2020   Hormone replacement therapy (HRT) 03/29/2020   Menopausal symptoms 03/29/2020   Healthcare maintenance 01/30/2019   Hypertrophy of tonsils alone 01/30/2019   Hx of urinary stone 12/27/2013   Internal/external hemorrhoids with bleeding 01/30/2012   Neuroma of hand 12/01/2011   Sleep disorder 08/25/2011   Fatigue 08/25/2011   Moodiness 08/25/2011   Local infection of skin and  subcutaneous tissue 04/27/2010   NEPHROLITHIASIS, HX OF 12/24/2008   Herpes simplex virus (HSV) infection 01/29/2007   Hematuria 01/29/2007    Allergies:  Allergies  Allergen Reactions   Bee Venom Other (See Comments)    Patient receives antibiotic cream for infection due to sting   Sulfonamide Derivatives Rash   Medications:  Current Outpatient Medications:     amoxicillin-clavulanate (AUGMENTIN) 875-125 MG tablet, Take 1 tablet by mouth 2 (two) times daily., Disp: 14 tablet, Rfl: 0   estradiol (VIVELLE-DOT) 0.05 MG/24HR patch, Place 1 patch (0.05 mg total) onto the skin 2 (two) times a week., Disp: 24 patch, Rfl: 3   medroxyPROGESTERone (PROVERA) 5 MG tablet, Take 1 tab orally day 1-15 each month., Disp: 45 tablet, Rfl: 3   Multiple Vitamin (MULTIVITAMIN) tablet, Take 1 tablet by mouth daily., Disp: , Rfl:    Probiotic Product (PROBIOTIC DAILY PO), Take by mouth daily., Disp: , Rfl:    progesterone (PROMETRIUM) 200 MG capsule, Take days 1-15 each month, Disp: 45 capsule, Rfl: 3   valACYclovir (VALTREX) 500 MG tablet, Take 1 tablet (500 mg total) by mouth daily., Disp: 90 tablet, Rfl: 3  Observations/Objective: Patient is well-developed, well-nourished in no acute distress.  Resting comfortably  at home.  Head is normocephalic, atraumatic.  No labored breathing. Speech is clear and coherent with logical content.  Patient is alert and oriented at baseline.  Sinus congestion and pain Hoarse voice   Assessment and Plan: 1. Acute sinusitis, recurrence not specified, unspecified location (Primary) - amoxicillin-clavulanate (AUGMENTIN) 875-125 MG tablet; Take 1 tablet by mouth 2 (two) times daily.  Dispense: 14 tablet; Refill: 0  - Take meds as prescribed - Use a cool mist humidifier  -Use saline nose sprays frequently -Force fluids -For any cough or congestion  Use plain Mucinex- regular strength or max strength is fine -For fever or aces or pains- take tylenol or ibuprofen. -Throat lozenges if help -Follow up if symptoms worsen or do not improve   Follow Up Instructions: I discussed the assessment and treatment plan with the patient. The patient was provided an opportunity to ask questions and all were answered. The patient agreed with the plan and demonstrated an understanding of the instructions.  A copy of instructions were sent to the  patient via MyChart unless otherwise noted below.     The patient was advised to call back or seek an in-person evaluation if the symptoms worsen or if the condition fails to improve as anticipated.    Jannifer Rodney, FNP

## 2023-09-12 DIAGNOSIS — Z85828 Personal history of other malignant neoplasm of skin: Secondary | ICD-10-CM | POA: Diagnosis not present

## 2023-09-12 DIAGNOSIS — D2272 Melanocytic nevi of left lower limb, including hip: Secondary | ICD-10-CM | POA: Diagnosis not present

## 2023-09-12 DIAGNOSIS — L814 Other melanin hyperpigmentation: Secondary | ICD-10-CM | POA: Diagnosis not present

## 2023-09-12 DIAGNOSIS — L821 Other seborrheic keratosis: Secondary | ICD-10-CM | POA: Diagnosis not present

## 2024-04-09 ENCOUNTER — Other Ambulatory Visit (HOSPITAL_BASED_OUTPATIENT_CLINIC_OR_DEPARTMENT_OTHER): Payer: Self-pay | Admitting: Obstetrics & Gynecology

## 2024-04-09 DIAGNOSIS — Z79890 Hormone replacement therapy (postmenopausal): Secondary | ICD-10-CM

## 2024-04-24 ENCOUNTER — Encounter (HOSPITAL_BASED_OUTPATIENT_CLINIC_OR_DEPARTMENT_OTHER): Payer: Self-pay | Admitting: Obstetrics & Gynecology

## 2024-04-24 ENCOUNTER — Ambulatory Visit (INDEPENDENT_AMBULATORY_CARE_PROVIDER_SITE_OTHER): Admitting: Obstetrics & Gynecology

## 2024-04-24 ENCOUNTER — Other Ambulatory Visit (HOSPITAL_COMMUNITY)
Admission: RE | Admit: 2024-04-24 | Discharge: 2024-04-24 | Disposition: A | Discharge status: Home / Self Care | Source: Ambulatory Visit | Attending: Obstetrics & Gynecology | Admitting: Obstetrics & Gynecology

## 2024-04-24 VITALS — BP 129/80 | HR 70 | Ht 64.0 in | Wt 153.6 lb

## 2024-04-24 DIAGNOSIS — Z1151 Encounter for screening for human papillomavirus (HPV): Secondary | ICD-10-CM | POA: Diagnosis not present

## 2024-04-24 DIAGNOSIS — Z01419 Encounter for gynecological examination (general) (routine) without abnormal findings: Secondary | ICD-10-CM

## 2024-04-24 DIAGNOSIS — Z124 Encounter for screening for malignant neoplasm of cervix: Secondary | ICD-10-CM | POA: Diagnosis not present

## 2024-04-24 DIAGNOSIS — R413 Other amnesia: Secondary | ICD-10-CM | POA: Diagnosis not present

## 2024-04-24 DIAGNOSIS — Z79890 Hormone replacement therapy (postmenopausal): Secondary | ICD-10-CM

## 2024-04-24 DIAGNOSIS — B009 Herpesviral infection, unspecified: Secondary | ICD-10-CM

## 2024-04-24 DIAGNOSIS — Z1231 Encounter for screening mammogram for malignant neoplasm of breast: Secondary | ICD-10-CM

## 2024-04-24 MED ORDER — ESTRADIOL-NORETHINDRONE ACET 0.5-0.1 MG PO TABS
1.0000 | ORAL_TABLET | Freq: Every day | ORAL | 2 refills | Status: AC
Start: 2024-04-24 — End: ?

## 2024-04-24 MED ORDER — VALACYCLOVIR HCL 500 MG PO TABS: 500.0000 mg | ORAL_TABLET | Freq: Every day | ORAL | 3 refills | Status: AC

## 2024-04-24 NOTE — Progress Notes (Unsigned)
 ANNUAL EXAM Patient name: Meghan Frank MRN [id number]  Date of birth: 1966-05-12 Chief Complaint:   No chief complaint on file.  History of Present Illness:   Meghan Frank is a 58 y.o. G30P2002 Caucasian female being seen today for a routine annual exam.  Still having some hot flashes.  This is especially during the days she takes her progesterone .  We switched this to see if this would help and it has not.    Is concerned about memory issues.  But she is having trouble with name recall.  She uses GPS anywhere she drives, for the most part.    No LMP recorded. Patient has had an ablation.   The pregnancy intention screening data noted above was reviewed. Potential methods of contraception were discussed. The patient elected to proceed with No data recorded.   Last pap: 03/22/2021. Results were: NILM w/ HRHPV negative. H/O abnormal pap: yes Last mammogram: 08/16/2022. Results were: normal. Patient reports that she has not had mammogram done yet this year. Family h/o breast cancer: no Last colonoscopy: 04/26/2022. Results were: abnormal three polyps. Family h/o colorectal cancer: yes maternal grandfather.     04/07/2023    9:33 AM 03/25/2022    9:38 AM 03/22/2021    4:20 PM 06/10/2020    3:46 PM 04/16/2019    8:17 AM  Depression screen PHQ 2/9  Decreased Interest 0 0 0 0 0  Down, Depressed, Hopeless 0 0 0 0 0  PHQ - 2 Score 0 0 0 0 0  Altered sleeping    0 1  Tired, decreased energy    0 0  Change in appetite    0 0  Feeling bad or failure about yourself     0 0  Trouble concentrating    0 0  Moving slowly or fidgety/restless    0 0  Suicidal thoughts    0 0  PHQ-9 Score    0  1   Difficult doing work/chores     Not difficult at all     Data saved with a previous flowsheet row definition         No data to display           Review of Systems:   Pertinent items are noted in HPI Denies any headaches, blurred vision, fatigue, shortness of breath, chest pain,  abdominal pain, abnormal vaginal discharge/itching/odor/irritation, problems with periods, bowel movements, urination, or intercourse unless otherwise stated above. Pertinent History Reviewed:  Reviewed past medical,surgical, social and family history.  Reviewed problem list, medications and allergies. Physical Assessment:  There were no vitals filed for this visit.There is no height or weight on file to calculate BMI.        Physical Examination:   General appearance - well appearing, and in no distress  Mental status - alert, oriented to person, place, and time  Psych:  She has a normal mood and affect  Skin - warm and dry, normal color, no suspicious lesions noted  Chest - effort normal, all lung fields clear to auscultation bilaterally  Heart - normal rate and regular rhythm  Neck:  midline trachea, no thyromegaly or nodules  Breasts - breasts appear normal, no suspicious masses, no skin or nipple changes or  axillary nodes  Abdomen - soft, nontender, nondistended, no masses or organomegaly  Pelvic - VULVA: normal appearing vulva with no masses, tenderness or lesions  VAGINA: normal appearing vagina with normal color and discharge, no lesions  CERVIX: normal appearing cervix without discharge or lesions, no CMT  Thin prep pap is {Desc; done/not:10129} *** HR HPV cotesting  UTERUS: uterus is felt to be normal size, shape, consistency and nontender   ADNEXA: No adnexal masses or tenderness noted.  Rectal - normal rectal, good sphincter tone, no masses felt. Hemoccult: ***  Extremities:  No swelling or varicosities noted  Chaperone present for exam  No results found for this or any previous visit (from the past 24 hours).  Assessment & Plan:    ***Cognitive evaluation  No orders of the defined types were placed in this encounter.   Meds: No orders of the defined types were placed in this encounter.   Follow-up: No follow-ups on file.  Sprague, Kaitlyn E, RN 04/24/2024 8:07  AM

## 2024-04-26 ENCOUNTER — Ambulatory Visit
Admission: RE | Admit: 2024-04-26 | Discharge: 2024-04-26 | Disposition: A | Discharge status: Home / Self Care | Source: Ambulatory Visit | Attending: Obstetrics & Gynecology | Admitting: Obstetrics & Gynecology

## 2024-04-26 ENCOUNTER — Telehealth: Payer: Self-pay | Admitting: Physician Assistant

## 2024-04-26 DIAGNOSIS — Z1231 Encounter for screening mammogram for malignant neoplasm of breast: Secondary | ICD-10-CM | POA: Diagnosis not present

## 2024-04-26 NOTE — Telephone Encounter (Signed)
 Patient is wondering if she can establish care with Dr.Tabori as her husband is a former patient of hers

## 2024-04-26 NOTE — Telephone Encounter (Signed)
 Copied from CRM [id number]. Topic: Appointments - Scheduling Inquiry for Clinic >> Apr 26, 2024  2:32 PM Meghan Frank wrote:   Reason for CRM: Patient called requesting to get scheduled with Dr. Mahlon, she stated her spouse Meghan Frank is a current patient of his.

## 2024-04-26 NOTE — Telephone Encounter (Signed)
Please call and schedule establish care visit

## 2024-04-26 NOTE — Telephone Encounter (Signed)
 Ok to establish

## 2024-04-29 ENCOUNTER — Ambulatory Visit (HOSPITAL_BASED_OUTPATIENT_CLINIC_OR_DEPARTMENT_OTHER): Payer: Self-pay | Admitting: Obstetrics & Gynecology

## 2024-04-29 LAB — CYTOLOGY - PAP
Adequacy: ABSENT
Comment: NEGATIVE
Diagnosis: NEGATIVE
Diagnosis: REACTIVE
High risk HPV: NEGATIVE

## 2024-05-06 ENCOUNTER — Other Ambulatory Visit: Payer: Self-pay | Admitting: Obstetrics & Gynecology

## 2024-05-06 DIAGNOSIS — R928 Other abnormal and inconclusive findings on diagnostic imaging of breast: Secondary | ICD-10-CM

## 2024-05-16 ENCOUNTER — Encounter: Payer: Self-pay | Admitting: Family Medicine

## 2024-05-16 ENCOUNTER — Ambulatory Visit: Admitting: Family Medicine

## 2024-05-16 VITALS — BP 124/70 | HR 82 | Temp 98.9°F | Ht 63.5 in | Wt 153.6 lb

## 2024-05-16 DIAGNOSIS — Z0000 Encounter for general adult medical examination without abnormal findings: Secondary | ICD-10-CM | POA: Diagnosis not present

## 2024-05-16 DIAGNOSIS — E663 Overweight: Secondary | ICD-10-CM | POA: Insufficient documentation

## 2024-05-16 DIAGNOSIS — Z23 Encounter for immunization: Secondary | ICD-10-CM | POA: Diagnosis not present

## 2024-05-16 LAB — CBC WITH DIFFERENTIAL/PLATELET
Basophils Absolute: 0 K/uL (ref 0.0–0.1)
Basophils Relative: 0.9 % (ref 0.0–3.0)
Eosinophils Absolute: 0.1 K/uL (ref 0.0–0.7)
Eosinophils Relative: 1.6 % (ref 0.0–5.0)
HCT: 39.4 % (ref 36.0–46.0)
Hemoglobin: 13.3 g/dL (ref 12.0–15.0)
Lymphocytes Relative: 35.3 % (ref 12.0–46.0)
Lymphs Abs: 1.9 K/uL (ref 0.7–4.0)
MCHC: 33.9 g/dL (ref 30.0–36.0)
MCV: 93.2 fl (ref 78.0–100.0)
Monocytes Absolute: 0.5 K/uL (ref 0.1–1.0)
Monocytes Relative: 8.3 % (ref 3.0–12.0)
Neutro Abs: 2.9 K/uL (ref 1.4–7.7)
Neutrophils Relative %: 53.9 % (ref 43.0–77.0)
Platelets: 273 K/uL (ref 150.0–400.0)
RBC: 4.22 Mil/uL (ref 3.87–5.11)
RDW: 13.8 % (ref 11.5–15.5)
WBC: 5.5 K/uL (ref 4.0–10.5)

## 2024-05-16 LAB — LIPID PANEL
Cholesterol: 173 mg/dL (ref 0–200)
HDL: 62.7 mg/dL (ref >39.00)
LDL Cholesterol: 100 mg/dL — ABNORMAL HIGH (ref 0–99)
NonHDL: 110.12
Total CHOL/HDL Ratio: 3
Triglycerides: 51 mg/dL (ref 0.0–149.0)
VLDL: 10.2 mg/dL (ref 0.0–40.0)

## 2024-05-16 LAB — TSH: TSH: 1.96 u[IU]/mL (ref 0.35–5.50)

## 2024-05-16 LAB — BASIC METABOLIC PANEL WITH GFR
BUN: 15 mg/dL (ref 6–23)
CO2: 29 meq/L (ref 19–32)
Calcium: 9.3 mg/dL (ref 8.4–10.5)
Chloride: 103 meq/L (ref 96–112)
Creatinine, Ser: 0.69 mg/dL (ref 0.40–1.20)
GFR: 96.23 mL/min (ref >60.00)
Glucose, Bld: 83 mg/dL (ref 70–99)
Potassium: 3.8 meq/L (ref 3.5–5.1)
Sodium: 138 meq/L (ref 135–145)

## 2024-05-16 LAB — HEPATIC FUNCTION PANEL
ALT: 18 U/L (ref 0–35)
AST: 22 U/L (ref 0–37)
Albumin: 4.5 g/dL (ref 3.5–5.2)
Alkaline Phosphatase: 44 U/L (ref 39–117)
Bilirubin, Direct: 0.2 mg/dL (ref 0.0–0.3)
Total Bilirubin: 1.3 mg/dL — ABNORMAL HIGH (ref 0.2–1.2)
Total Protein: 7.5 g/dL (ref 6.0–8.3)

## 2024-05-16 NOTE — Patient Instructions (Signed)
 Follow up in 1 year or as needed We'll notify you of your lab results and make any changes if needed Continue to work on healthy diet and regular exercise- you can do it! Call with any questions or concerns Stay Safe!  Stay Healthy! Welcome!  We're glad to have you! Happy Holidays!!!

## 2024-05-16 NOTE — Progress Notes (Signed)
° °  Subjective:    Patient ID: Meghan Frank, female    DOB: 26-Jan-1966, 58 y.o.   MRN: [id number]  HPI CPE-  UTD on pap, mammo, colonoscopy.  UTD on Tdap.  Sees Dr Cleotilde.  No concerns today.  Health Maintenance  Topic Date Due   Hepatitis B Vaccines 19-59 Average Risk (1 of 3 - 19+ 3-dose series) Never done   Pneumococcal Vaccine: 50+ Years (1 of 1 - PCV) Never done   Zoster Vaccines- Shingrix (1 of 2) Never done   COVID-19 Vaccine (1 - 2025-26 season) Never done   Influenza Vaccine  09/03/2024 (Originally 01/05/2024)   Mammogram  04/26/2025   Colonoscopy  04/27/2027   DTaP/Tdap/Td (3 - Td or Tdap) 08/12/2028   Cervical Cancer Screening (HPV/Pap Cotest)  04/24/2029   Hepatitis C Screening  Completed   HIV Screening  Completed   HPV VACCINES  Aged Out   Meningococcal B Vaccine  Aged Out    Patient Care Team    Relationship Specialty Notifications Start End  Mahlon Comer BRAVO, MD PCP - General Family Medicine  05/16/24   Nada Macintosh, FNP Nurse Practitioner Nurse Practitioner  08/25/11   Chales Idol, MD Attending Physician Urology  08/25/11   Charlett Apolinar POUR, MD Consulting Physician Internal Medicine  01/30/19   Cleotilde Ronal RAMAN, MD Consulting Physician Gynecology  02/14/19       Review of Systems Patient reports no vision/ hearing changes, adenopathy,fever, weight change,  persistant/recurrent hoarseness , swallowing issues, chest pain, palpitations, edema, persistant/recurrent cough, hemoptysis, dyspnea (rest/exertional/paroxysmal nocturnal), gastrointestinal bleeding (melena, rectal bleeding), abdominal pain, significant heartburn, bowel changes, GU symptoms (dysuria, hematuria, incontinence), Gyn symptoms (abnormal  bleeding, pain),  syncope, focal weakness, memory loss, numbness & tingling, skin/hair/nail changes, abnormal bruising or bleeding, anxiety, or depression.     Objective:   Physical Exam General Appearance:    Alert, cooperative, no distress,  appears stated age  Head:    Normocephalic, without obvious abnormality, atraumatic  Eyes:    PERRL, conjunctiva/corneas clear, EOM's intact both eyes  Ears:    Normal TM's and external ear canals, both ears  Nose:   Nares normal, septum midline, mucosa normal, no drainage    or sinus tenderness  Throat:   Lips, mucosa, and tongue normal; teeth and gums normal  Neck:   Supple, symmetrical, trachea midline, no adenopathy;    Thyroid : no enlargement/tenderness/nodules  Back:     Symmetric, no curvature, ROM normal, no CVA tenderness  Lungs:     Clear to auscultation bilaterally, respirations unlabored  Chest Wall:    No tenderness or deformity   Heart:    Regular rate and rhythm, S1 and S2 normal, no murmur, rub   or gallop  Breast Exam:    Deferred to GYN  Abdomen:     Soft, non-tender, bowel sounds active all four quadrants,    no masses, no organomegaly  Genitalia:    Deferred to GYN  Rectal:    Extremities:   Extremities normal, atraumatic, no cyanosis or edema  Pulses:   2+ and symmetric all extremities  Skin:   Skin color, texture, turgor normal, no rashes or lesions  Lymph nodes:   Cervical, supraclavicular, and axillary nodes normal  Neurologic:   CNII-XII intact, normal strength, sensation and reflexes    throughout          Assessment & Plan:

## 2024-05-16 NOTE — Assessment & Plan Note (Signed)
 Pt's PE WNL.  UTD on pap, mammo, colonoscopy, Tdap.  Shingles shot given today.  Check labs.  Anticipatory guidance provided.

## 2024-05-17 ENCOUNTER — Ambulatory Visit: Payer: Self-pay | Admitting: Family Medicine

## 2024-05-20 ENCOUNTER — Other Ambulatory Visit: Payer: Self-pay | Admitting: Obstetrics & Gynecology

## 2024-05-20 ENCOUNTER — Inpatient Hospital Stay
Admission: RE | Admit: 2024-05-20 | Discharge: 2024-05-20 | Attending: Obstetrics & Gynecology | Admitting: Obstetrics & Gynecology

## 2024-05-20 DIAGNOSIS — R928 Other abnormal and inconclusive findings on diagnostic imaging of breast: Secondary | ICD-10-CM

## 2024-05-20 DIAGNOSIS — N6489 Other specified disorders of breast: Secondary | ICD-10-CM | POA: Diagnosis not present

## 2024-05-28 ENCOUNTER — Telehealth (HOSPITAL_BASED_OUTPATIENT_CLINIC_OR_DEPARTMENT_OTHER): Payer: Self-pay

## 2024-05-28 NOTE — Telephone Encounter (Signed)
 Returned call to patient from voicemail left on the nurse line regarding a refill of her estradiol -norethindrone . Advised patient that I spoke with [facility] and they do have refills for the medication. They are getting it ready for her and will let her know when it is ready for pickup. Patient verbalizes understanding.

## 2024-06-14 ENCOUNTER — Encounter: Payer: Self-pay | Admitting: Family Medicine

## 2024-06-14 ENCOUNTER — Ambulatory Visit: Admitting: Family Medicine

## 2024-06-14 VITALS — BP 118/74 | HR 62 | Temp 98.7°F | Ht 63.5 in | Wt 154.0 lb

## 2024-06-14 DIAGNOSIS — J069 Acute upper respiratory infection, unspecified: Secondary | ICD-10-CM | POA: Diagnosis not present

## 2024-06-14 MED ORDER — BENZONATATE 200 MG PO CAPS: 200.0000 mg | ORAL_CAPSULE | Freq: Three times a day (TID) | ORAL | 0 refills | Status: AC | PRN

## 2024-06-14 MED ORDER — AZELASTINE HCL 0.1 % NA SOLN: 1.0000 | Freq: Two times a day (BID) | NASAL | 5 refills | Status: AC

## 2024-06-14 MED ORDER — PROMETHAZINE-DM 6.25-15 MG/5ML PO SYRP: 5.0000 mL | ORAL_SOLUTION | Freq: Four times a day (QID) | ORAL | 0 refills | Status: AC | PRN

## 2024-06-14 NOTE — Progress Notes (Signed)
" ° °  Subjective:    Patient ID: Meghan Frank, female    DOB: [birth year], 59 y.o.   MRN: [id number]  HPI URI- 'i've got a river of drainage'  sxs are causing her to cough, which is interfering w/ sleep.  Taking DayQuil and Nyquil sporadically w/o relief.  Sxs started a few days ago.  No fever.  Some maxillary sinus pressure.  Head feels full- particularly when lying down.  No ear pain.  + sick contacts.     Review of Systems For ROS see HPI     Objective:   Physical Exam Vitals reviewed.  Constitutional:      General: She is not in acute distress.    Appearance: Normal appearance. She is not ill-appearing.  HENT:     Head: Normocephalic and atraumatic.     Right Ear: Tympanic membrane and ear canal normal.     Left Ear: Tympanic membrane and ear canal normal.     Nose: Congestion present. No rhinorrhea.     Comments: No TTP over frontal or maxillary sinuses    Mouth/Throat:     Mouth: Mucous membranes are moist.     Comments: Copious PND Eyes:     Extraocular Movements: Extraocular movements intact.     Conjunctiva/sclera: Conjunctivae normal.  Cardiovascular:     Rate and Rhythm: Normal rate and regular rhythm.  Pulmonary:     Effort: Pulmonary effort is normal. No respiratory distress.     Breath sounds: No wheezing or rhonchi.     Comments: + cough Musculoskeletal:     Cervical back: Neck supple.  Lymphadenopathy:     Cervical: No cervical adenopathy.  Neurological:     General: No focal deficit present.     Mental Status: She is alert and oriented to person, place, and time.  Psychiatric:        Mood and Affect: Mood normal.        Behavior: Behavior normal.        Thought Content: Thought content normal.           Assessment & Plan:  URI- new.  No evidence of bacterial infxn on PE and sxs are consistent w/ viral illness.  Start cough meds prn.  Add Azelastine  to dry up nasal congestion and PND.  Reviewed supportive care and red flags that should prompt  return.  Pt expressed understanding and is in agreement w/ plan.   "

## 2024-06-14 NOTE — Patient Instructions (Signed)
 Follow up as needed or as scheduled CONTINUE the Dayquil and Nyquil for symptom relief ADD the nasal spray twice daily to dry up the drainage ADD a daily Claritin or Zyrtec until feeling better USE the cough syrup for nights and weekends- may cause drowsiness TAKE the cough pills in addition to Dayquil and Nyquil as needed Drink LOTS of fluids REST! Hang in there!!!

## 2025-05-19 ENCOUNTER — Encounter: Admitting: Family Medicine
